# Patient Record
Sex: Female | Born: 1980 | Race: Black or African American | Hispanic: No | Marital: Married | Smoking: Never smoker
Health system: Southern US, Community
[De-identification: ages and names within clinical notes are randomized; demographics above are authoritative.]

## PROBLEM LIST (undated history)

## (undated) DIAGNOSIS — Z973 Presence of spectacles and contact lenses: Secondary | ICD-10-CM

## (undated) DIAGNOSIS — O139 Gestational [pregnancy-induced] hypertension without significant proteinuria, unspecified trimester: Secondary | ICD-10-CM

## (undated) DIAGNOSIS — R51 Headache: Secondary | ICD-10-CM

## (undated) DIAGNOSIS — J45909 Unspecified asthma, uncomplicated: Secondary | ICD-10-CM

## (undated) DIAGNOSIS — K449 Diaphragmatic hernia without obstruction or gangrene: Secondary | ICD-10-CM

## (undated) DIAGNOSIS — D259 Leiomyoma of uterus, unspecified: Secondary | ICD-10-CM

## (undated) DIAGNOSIS — B999 Unspecified infectious disease: Secondary | ICD-10-CM

## (undated) DIAGNOSIS — J309 Allergic rhinitis, unspecified: Secondary | ICD-10-CM

## (undated) DIAGNOSIS — D509 Iron deficiency anemia, unspecified: Secondary | ICD-10-CM

## (undated) HISTORY — DX: Gestational (pregnancy-induced) hypertension without significant proteinuria, unspecified trimester: O13.9

## (undated) HISTORY — DX: Unspecified infectious disease: B99.9

## (undated) HISTORY — PX: OVARIAN CYST SURGERY: SHX726

## (undated) HISTORY — DX: Headache: R51

---

## 2002-09-16 HISTORY — PX: RIGHT OOPHORECTOMY: SHX2359

## 2009-09-16 DIAGNOSIS — G43E09 Chronic migraine with aura, not intractable, without status migrainosus: Secondary | ICD-10-CM

## 2009-09-16 DIAGNOSIS — R51 Headache: Secondary | ICD-10-CM

## 2009-09-16 HISTORY — DX: Headache: R51

## 2009-09-16 HISTORY — DX: Chronic migraine with aura, not intractable, without status migrainosus: G43.E09

## 2010-09-16 DIAGNOSIS — O139 Gestational [pregnancy-induced] hypertension without significant proteinuria, unspecified trimester: Secondary | ICD-10-CM

## 2010-09-16 DIAGNOSIS — Z8759 Personal history of other complications of pregnancy, childbirth and the puerperium: Secondary | ICD-10-CM

## 2010-09-16 HISTORY — DX: Personal history of other complications of pregnancy, childbirth and the puerperium: Z87.59

## 2010-09-16 HISTORY — DX: Gestational (pregnancy-induced) hypertension without significant proteinuria, unspecified trimester: O13.9

## 2012-09-16 DIAGNOSIS — J45909 Unspecified asthma, uncomplicated: Secondary | ICD-10-CM

## 2012-09-16 DIAGNOSIS — B999 Unspecified infectious disease: Secondary | ICD-10-CM

## 2012-09-16 DIAGNOSIS — Z8759 Personal history of other complications of pregnancy, childbirth and the puerperium: Secondary | ICD-10-CM

## 2012-09-16 HISTORY — DX: Personal history of other complications of pregnancy, childbirth and the puerperium: Z87.59

## 2012-09-16 HISTORY — DX: Unspecified asthma, uncomplicated: J45.909

## 2012-09-16 HISTORY — DX: Unspecified infectious disease: B99.9

## 2012-09-16 NOTE — L&D Delivery Note (Signed)
Delivery Note At 6:26 PM a viable female, "Whitney Silva",  was delivered via Vaginal, Spontaneous Delivery (Presentation: Right Occiput Anterior).  APGAR: 8, 9; weight 6 lb 1.2 oz (2756 g).   Occult prolapse noted at delivery. Placenta status: Intact, Spontaneous Pathology.  Cord: 3 vessels with the following complications: None.  Cord pH: NA  Anesthesia: None  Episiotomy: None Lacerations: 2nd degree;Perineal Suture Repair: 3.0 monocryl Est. Blood Loss (mL): 250  Mom to postpartum.  Baby to skin to skin.  Will continue magnesium sulfate infusion x 24 hours post delivery Repeat PIH labs in the am. Continue Labetalol 200 mg po BID, with prn IV Labetalol for systolic BP >: 160 or diastolic > 105. Placenta to pathology..  Family plans inpatient circumcision.  Nigel Bridgeman 03/26/2013, 7:18 PM

## 2012-10-01 ENCOUNTER — Emergency Department (HOSPITAL_COMMUNITY): Payer: 59

## 2012-10-01 ENCOUNTER — Inpatient Hospital Stay (HOSPITAL_COMMUNITY)
Admission: EM | Admit: 2012-10-01 | Discharge: 2012-10-03 | DRG: 781 | Disposition: A | Discharge status: Home / Self Care | Payer: 59 | Attending: Internal Medicine | Admitting: Internal Medicine

## 2012-10-01 ENCOUNTER — Encounter (HOSPITAL_COMMUNITY): Payer: Self-pay | Admitting: *Deleted

## 2012-10-01 DIAGNOSIS — E876 Hypokalemia: Secondary | ICD-10-CM | POA: Diagnosis present

## 2012-10-01 DIAGNOSIS — E872 Acidosis, unspecified: Secondary | ICD-10-CM

## 2012-10-01 DIAGNOSIS — R1110 Vomiting, unspecified: Secondary | ICD-10-CM

## 2012-10-01 DIAGNOSIS — J9600 Acute respiratory failure, unspecified whether with hypoxia or hypercapnia: Secondary | ICD-10-CM | POA: Diagnosis present

## 2012-10-01 DIAGNOSIS — D649 Anemia, unspecified: Secondary | ICD-10-CM

## 2012-10-01 DIAGNOSIS — Z886 Allergy status to analgesic agent status: Secondary | ICD-10-CM

## 2012-10-01 DIAGNOSIS — J45909 Unspecified asthma, uncomplicated: Secondary | ICD-10-CM

## 2012-10-01 DIAGNOSIS — J101 Influenza due to other identified influenza virus with other respiratory manifestations: Secondary | ICD-10-CM

## 2012-10-01 DIAGNOSIS — A419 Sepsis, unspecified organism: Secondary | ICD-10-CM | POA: Diagnosis present

## 2012-10-01 DIAGNOSIS — E860 Dehydration: Secondary | ICD-10-CM | POA: Diagnosis present

## 2012-10-01 DIAGNOSIS — R062 Wheezing: Secondary | ICD-10-CM

## 2012-10-01 DIAGNOSIS — O99019 Anemia complicating pregnancy, unspecified trimester: Secondary | ICD-10-CM | POA: Diagnosis present

## 2012-10-01 DIAGNOSIS — R6510 Systemic inflammatory response syndrome (SIRS) of non-infectious origin without acute organ dysfunction: Secondary | ICD-10-CM | POA: Diagnosis present

## 2012-10-01 DIAGNOSIS — O98819 Other maternal infectious and parasitic diseases complicating pregnancy, unspecified trimester: Principal | ICD-10-CM | POA: Diagnosis present

## 2012-10-01 DIAGNOSIS — D72829 Elevated white blood cell count, unspecified: Secondary | ICD-10-CM | POA: Diagnosis present

## 2012-10-01 DIAGNOSIS — R509 Fever, unspecified: Secondary | ICD-10-CM

## 2012-10-01 DIAGNOSIS — O99891 Other specified diseases and conditions complicating pregnancy: Secondary | ICD-10-CM | POA: Diagnosis present

## 2012-10-01 DIAGNOSIS — J45901 Unspecified asthma with (acute) exacerbation: Secondary | ICD-10-CM | POA: Diagnosis present

## 2012-10-01 HISTORY — DX: Unspecified asthma, uncomplicated: J45.909

## 2012-10-01 LAB — CBC WITH DIFFERENTIAL/PLATELET
Basophils Relative: 0 % (ref 0–1)
Eosinophils Absolute: 0 10*3/uL (ref 0.0–0.7)
Eosinophils Relative: 0 % (ref 0–5)
HCT: 38.8 % (ref 36.0–46.0)
Hemoglobin: 13.8 g/dL (ref 12.0–15.0)
MCH: 29.4 pg (ref 26.0–34.0)
MCHC: 35.6 g/dL (ref 30.0–36.0)
MCV: 82.6 fL (ref 78.0–100.0)
Monocytes Absolute: 0.6 10*3/uL (ref 0.1–1.0)
Monocytes Relative: 5 % (ref 3–12)

## 2012-10-01 LAB — POCT I-STAT, CHEM 8
BUN: <3 mg/dL — ABNORMAL LOW (ref 6–23)
Creatinine, Ser: 0.7 mg/dL (ref 0.50–1.10)
Sodium: 137 mEq/L (ref 135–145)
TCO2: 23 mmol/L (ref 0–100)

## 2012-10-01 MED ORDER — ALBUTEROL SULFATE (5 MG/ML) 0.5% IN NEBU
2.5000 mg | INHALATION_SOLUTION | RESPIRATORY_TRACT | Status: DC
  Filled 2012-10-01: qty 0.5

## 2012-10-01 MED ORDER — ONDANSETRON HCL 4 MG PO TABS: 4.0000 mg | ORAL_TABLET | Freq: Four times a day (QID) | ORAL | Status: DC | PRN

## 2012-10-01 MED ORDER — ALBUTEROL SULFATE (5 MG/ML) 0.5% IN NEBU: 2.5000 mg | INHALATION_SOLUTION | RESPIRATORY_TRACT | Status: DC | PRN

## 2012-10-01 MED ORDER — ENOXAPARIN SODIUM 30 MG/0.3ML ~~LOC~~ SOLN: 30.0000 mg | SUBCUTANEOUS | Status: DC

## 2012-10-01 MED ORDER — IPRATROPIUM BROMIDE 0.02 % IN SOLN
0.5000 mg | Freq: Four times a day (QID) | RESPIRATORY_TRACT | Status: DC
  Administered 2012-10-02 (×2): 0.5 mg via RESPIRATORY_TRACT
  Filled 2012-10-01 (×2): qty 2.5

## 2012-10-01 MED ORDER — METHYLPREDNISOLONE SODIUM SUCC 125 MG IJ SOLR
80.0000 mg | Freq: Four times a day (QID) | INTRAMUSCULAR | Status: DC
  Administered 2012-10-02: 80 mg via INTRAVENOUS
  Filled 2012-10-01: qty 1.28
  Filled 2012-10-01: qty 2
  Filled 2012-10-01 (×4): qty 1.28

## 2012-10-01 MED ORDER — ALBUTEROL SULFATE (5 MG/ML) 0.5% IN NEBU
2.5000 mg | INHALATION_SOLUTION | Freq: Four times a day (QID) | RESPIRATORY_TRACT | Status: DC
  Administered 2012-10-02: 2.5 mg via RESPIRATORY_TRACT
  Filled 2012-10-01: qty 0.5

## 2012-10-01 MED ORDER — SODIUM CHLORIDE 0.9 % IV BOLUS (SEPSIS)
1000.0000 mL | Freq: Once | INTRAVENOUS | Status: AC
  Administered 2012-10-01: 1000 mL via INTRAVENOUS

## 2012-10-01 MED ORDER — METHYLPREDNISOLONE SODIUM SUCC 125 MG IJ SOLR
125.0000 mg | Freq: Once | INTRAMUSCULAR | Status: AC
  Administered 2012-10-01: 125 mg via INTRAVENOUS
  Filled 2012-10-01: qty 2

## 2012-10-01 MED ORDER — OSELTAMIVIR PHOSPHATE 75 MG PO CAPS
75.0000 mg | ORAL_CAPSULE | Freq: Two times a day (BID) | ORAL | Status: DC
  Administered 2012-10-02 (×3): 75 mg via ORAL
  Filled 2012-10-01 (×5): qty 1

## 2012-10-01 MED ORDER — ONDANSETRON HCL 4 MG/2ML IJ SOLN: 4.0000 mg | Freq: Three times a day (TID) | INTRAMUSCULAR | Status: DC | PRN

## 2012-10-01 MED ORDER — IPRATROPIUM BROMIDE 0.02 % IN SOLN
0.5000 mg | Freq: Once | RESPIRATORY_TRACT | Status: AC
  Administered 2012-10-01: 0.5 mg via RESPIRATORY_TRACT
  Filled 2012-10-01: qty 2.5

## 2012-10-01 MED ORDER — ALBUTEROL SULFATE (5 MG/ML) 0.5% IN NEBU
5.0000 mg | INHALATION_SOLUTION | Freq: Once | RESPIRATORY_TRACT | Status: AC
  Administered 2012-10-01: 5 mg via RESPIRATORY_TRACT
  Filled 2012-10-01: qty 1

## 2012-10-01 MED ORDER — POTASSIUM CHLORIDE CRYS ER 20 MEQ PO TBCR
40.0000 meq | EXTENDED_RELEASE_TABLET | Freq: Once | ORAL | Status: AC
  Administered 2012-10-02: 40 meq via ORAL
  Filled 2012-10-01: qty 2

## 2012-10-01 MED ORDER — ONDANSETRON HCL 4 MG/2ML IJ SOLN
4.0000 mg | Freq: Once | INTRAMUSCULAR | Status: AC
  Administered 2012-10-01: 4 mg via INTRAVENOUS
  Filled 2012-10-01: qty 2

## 2012-10-01 MED ORDER — HYDROCODONE-ACETAMINOPHEN 5-325 MG PO TABS
1.0000 | ORAL_TABLET | ORAL | Status: DC | PRN
  Administered 2012-10-02 (×3): 2 via ORAL
  Filled 2012-10-01 (×3): qty 2

## 2012-10-01 MED ORDER — ONDANSETRON HCL 4 MG/2ML IJ SOLN
4.0000 mg | Freq: Four times a day (QID) | INTRAMUSCULAR | Status: DC | PRN
  Administered 2012-10-02 (×3): 4 mg via INTRAVENOUS
  Filled 2012-10-01 (×4): qty 2

## 2012-10-01 MED ORDER — SODIUM CHLORIDE 0.9 % IV SOLN: INTRAVENOUS | Status: DC

## 2012-10-01 MED ORDER — SODIUM CHLORIDE 0.9 % IV SOLN
INTRAVENOUS | Status: DC
  Administered 2012-10-02 (×2): via INTRAVENOUS

## 2012-10-01 MED ORDER — SODIUM CHLORIDE 0.9 % IJ SOLN: 3.0000 mL | Freq: Two times a day (BID) | INTRAMUSCULAR | Status: DC

## 2012-10-01 MED ORDER — ACETAMINOPHEN 325 MG PO TABS
650.0000 mg | ORAL_TABLET | Freq: Once | ORAL | Status: AC
  Administered 2012-10-01: 650 mg via ORAL
  Filled 2012-10-01: qty 2

## 2012-10-01 MED ORDER — GUAIFENESIN-DM 100-10 MG/5ML PO SYRP: 5.0000 mL | ORAL_SOLUTION | ORAL | Status: DC | PRN

## 2012-10-01 NOTE — ED Notes (Signed)
Pt will urinate after breathing treatment.

## 2012-10-01 NOTE — H&P (Signed)
[facility] History and Physical  Whitney Silva ZOX:[id number] DOB: Jul 27, 1981 DOA: 10/01/2012  Referring physician: ED physician PCP: No primary provider on file.   Chief Complaint: Shortness of breath   HPI:  Pt is 32 yo female with asthma, [redacted] weeks pregnant who presents to [facility] ED with main concern of progressively worsening shortness of breath that initially started 1-2 days prior to admission and associated with fevers, chills, nausea, non bloody vomiting, generalized abdominal pain. Pt reports no specific aggravating or alleviating factors, no similar events in the past. She reports no chest pain, no other systemic symptoms, no recent sick contacts or exposures.  In ED, pt found to be febrile with T max 102 F and with leukocytosis, wheezing on exam even after albuterol and Atrovent administration, worrisome for H1N1. [facility] asked to admit to telemetry unit due to persistent tachycardia.  Assessment and Plan:  Principal Problem:  *Acute respiratory failure - unclear etiology at this time and possibly multifactorial and secondary to acute asthma exacerbation in the setting of ? H1N1  - will admit the pt to telemetry unit for further evaluation and management - will start with providing supportive care with Solumedrol, nebulizers scheduled and PRN, oxygen as needed - will also obtain H1N1 by PCR and place under droplet precautions - monitor vitals per floor protocol Active Problems:  Systemic inflammatory response syndrome (SIRS) - with leukocytosis, tachycardia, fever, source unknown yet and ? H1N1 - will start with supportive care as noted above and will also add Tamiflu until final reports is back - Tamiflu can be discontinued if PCR H1N1 negative - will provide IVF, monitor I's and O's  Nausea and vomiting - possibly related to flu illness - supportive care with antiemetics as needed  Hypokalemia - secondary to vomiting and albuterol nebulizer - supplement and repeat BMP in  AM  Code Status: Full Family Communication: Pt at bedside Disposition Plan: Admit to telemetry   Review of Systems:  Constitutional: Positive for fever, chills and malaise/fatigue. Negative for diaphoresis.  HENT: Negative for hearing loss, ear pain, nosebleeds, congestion, sore throat, neck pain, tinnitus and ear discharge.   Eyes: Negative for blurred vision, double vision, photophobia, pain, discharge and redness.  Respiratory: Positive for cough, shortness of breath, wheezing  Cardiovascular: Negative for chest pain, palpitations, orthopnea, claudication and leg swelling.  Gastrointestinal: Positive for nausea, vomiting and abdominal pain. Negative for heartburn, constipation, blood in stool and melena.  Genitourinary: Negative for dysuria, urgency, frequency, hematuria and flank pain.  Musculoskeletal: Negative for myalgias, back pain, joint pain and falls.  Skin: Negative for itching and rash.  Neurological: Negative for dizziness and weakness. Negative for tingling, tremors, sensory change, speech change, focal weakness, loss of consciousness and headaches.  Endo/Heme/Allergies: Negative for environmental allergies and polydipsia. Does not bruise/bleed easily.  Psychiatric/Behavioral: Negative for suicidal ideas. The patient is not nervous/anxious.      Past Medical History  Diagnosis Date  . Asthma     Past Surgical History  Procedure Date  . Ovarian cyst surgery     Social History:  reports that she has never smoked. She does not have any smokeless tobacco history on file. She reports that she does not drink alcohol or use illicit drugs.  Allergies  Allergen Reactions  . Nsaids Hives  . Aspirin Hives and Rash    No family history of cancers or heart disease   Prior to Admission medications   Medication Sig Start Date End Date Taking? Authorizing Provider  Multiple  Vitamin (MULTIVITAMIN WITH MINERALS) TABS Take 1 tablet by mouth daily.   Yes Historical Provider, MD     Physical Exam: Filed Vitals:   10/01/12 2200 10/01/12 2308 10/01/12 2313 10/01/12 2318  BP:   130/51   Pulse:   135   Temp: 102 F (38.9 C) 99.7 F (37.6 C) 100.9 F (38.3 C) 101.3 F (38.5 C)  TempSrc: Oral Oral Oral Rectal  Resp:   34   SpO2:   98%     Physical Exam  Constitutional: Appears well-developed and well-nourished. No distress.  HENT: Normocephalic. External right and left ear normal. Oropharynx is clear and moist.  Eyes: Conjunctivae and EOM are normal. PERRLA, no scleral icterus.  Neck: Normal ROM. Neck supple. No JVD. No tracheal deviation. No thyromegaly.  CVS: Regular rhythm, tachycardic, S1/S2 +, no murmurs, no gallops, no carotid bruit.  Pulmonary: Effort and breath sounds normal, expiratory wheezing present Abdominal: Soft. BS +,  no distension, tenderness, rebound or guarding.  Musculoskeletal: Normal range of motion. No edema and no tenderness.  Lymphadenopathy: No lymphadenopathy noted, cervical, inguinal. Neuro: Alert. Normal reflexes, muscle tone coordination. No cranial nerve deficit. Skin: Skin is warm and dry. No rash noted. Not diaphoretic. No erythema. No pallor.  Psychiatric: Normal mood and affect. Behavior, judgment, thought content normal.   Labs on Admission:  Basic Metabolic Panel:  Lab 10/01/12 1610  NA 137  K 3.4*  CL 103  CO2 --  GLUCOSE 100*  BUN <3*  CREATININE 0.70  CALCIUM --  MG --  PHOS --   CBC:  Lab 10/01/12 2233 10/01/12 2220  WBC -- 11.1*  NEUTROABS -- 9.9*  HGB 14.6 13.8  HCT 43.0 38.8  MCV -- 82.6  PLT -- 306   Radiological Exams on Admission: Dg Chest 2 View  10/01/2012  *RADIOLOGY REPORT*  Clinical Data: Cough, fever, shortness of breath  CHEST - 2 VIEW  Comparison: None.  Findings: Mild thoracic dextroscoliosis without evident underlying vertebral anomaly. Lungs clear.  Heart size and pulmonary vascularity normal.  No effusion.  IMPRESSION: No acute disease   Original Report Authenticated By: D.  Andria Rhein, MD     EKG: Normal sinus rhythm, no ST/T wave changes  Debbora Presto, MD  [facility] Pager [phone]  If 7PM-7AM, please contact night-coverage [email or web address] Password [facility] 10/01/2012, 11:36 PM

## 2012-10-01 NOTE — ED Notes (Signed)
Pt transported to XR.  

## 2012-10-01 NOTE — ED Notes (Signed)
Pt could not urinate.  Will try again in 30 minutes.   

## 2012-10-01 NOTE — ED Provider Notes (Signed)
History     CSN: [id number]  Arrival date & time 10/01/12  2039   First MD Initiated Contact with Patient 10/01/12 2150      Chief Complaint  Patient presents with  . Cough     HPI Patient began having cough fever chills and myalgias yesterday associated with nausea and vomiting today.  Patient's states she's approximately [redacted] weeks pregnant.  She has no previous medical history other than asthma.  She has noticed wheezing. Past Medical History  Diagnosis Date  . Asthma     Past Surgical History  Procedure Date  . Ovarian cyst surgery     No family history on file.  History  Substance Use Topics  . Smoking status: Never Smoker   . Smokeless tobacco: Not on file  . Alcohol Use: No    OB History    Grav Para Term Preterm Abortions TAB SAB Ect Mult Living   1               Review of Systems All other systems reviewed and are negative Allergies  Nsaids and Aspirin  Home Medications   Current Outpatient Rx  Name  Route  Sig  Dispense  Refill  . ADULT MULTIVITAMIN W/MINERALS CH   Oral   Take 1 tablet by mouth daily.           BP 130/51  Pulse 135  Temp 101.3 F (38.5 C) (Rectal)  Resp 34  SpO2 98%  LMP 07/12/2012  Physical Exam  Nursing note and vitals reviewed. Constitutional: She is oriented to person, place, and time. She appears well-developed and well-nourished. No distress.  HENT:  Head: Normocephalic and atraumatic.  Mouth/Throat: Mucous membranes are dry.  Eyes: Pupils are equal, round, and reactive to light.  Neck: Normal range of motion.  Cardiovascular: Intact distal pulses.  Tachycardia present.   Pulmonary/Chest: No respiratory distress.  Abdominal: Normal appearance. She exhibits no distension.  Musculoskeletal: Normal range of motion.  Neurological: She is alert and oriented to person, place, and time. No cranial nerve deficit.  Skin: Skin is warm and dry. No rash noted.  Psychiatric: She has a normal mood and affect. Her behavior  is normal.    ED Course  Procedures (including critical care time)  Labs Reviewed  CBC WITH DIFFERENTIAL - Abnormal; Notable for the following:    WBC 11.1 (*)     Neutrophils Relative 90 (*)     Neutro Abs 9.9 (*)     Lymphocytes Relative 5 (*)     Lymphs Abs 0.5 (*)     All other components within normal limits  HCG, SERUM, QUALITATIVE - Abnormal; Notable for the following:    Preg, Serum POSITIVE (*)     All other components within normal limits  POCT I-STAT, CHEM 8 - Abnormal; Notable for the following:    Potassium 3.4 (*)     BUN <3 (*)     Glucose, Bld 100 (*)     All other components within normal limits  INFLUENZA PANEL BY PCR  URINALYSIS, ROUTINE W REFLEX MICROSCOPIC     Medications  Multiple Vitamin (MULTIVITAMIN WITH MINERALS) TABS (not administered)  sodium chloride 0.9 % bolus 1,000 mL (1000 mL Intravenous New Bag/Given 10/01/12 2236)  albuterol (PROVENTIL) (5 MG/ML) 0.5% nebulizer solution 5 mg (5 mg Nebulization Given 10/01/12 2236)  ipratropium (ATROVENT) nebulizer solution 0.5 mg (0.5 mg Nebulization Given 10/01/12 2236)  ondansetron (ZOFRAN) injection 4 mg (4 mg Intravenous Given 10/01/12 2236)  acetaminophen (TYLENOL) tablet 650 mg (650 mg Oral Given 10/01/12 2206)  methylPREDNISolone sodium succinate (SOLU-MEDROL) 125 mg/2 mL injection 125 mg (125 mg Intravenous Given 10/01/12 2314)    Dg Chest 2 View  10/01/2012  *RADIOLOGY REPORT*  Clinical Data: Cough, fever, shortness of breath  CHEST - 2 VIEW  Comparison: None.  Findings: Mild thoracic dextroscoliosis without evident underlying vertebral anomaly. Lungs clear.  Heart size and pulmonary vascularity normal.  No effusion.  IMPRESSION: No acute disease   Original Report Authenticated By: D. Andria Rhein, MD      1. Febrile illness   2. Asthma   3. Wheezing   4. Vomiting   5. Dehydration       MDM         Nelia Shi, MD 10/01/12 2325

## 2012-10-01 NOTE — ED Notes (Signed)
Pt says she started coughing yesterday and has had nausea and vomiting today. Pt says she has had chills and feels hot. Voices [redacted] weeks pregnant

## 2012-10-01 NOTE — ED Notes (Signed)
Pt back from XR 

## 2012-10-01 NOTE — ED Notes (Signed)
MD at bedside. 

## 2012-10-02 ENCOUNTER — Encounter (HOSPITAL_COMMUNITY): Payer: Self-pay | Admitting: Emergency Medicine

## 2012-10-02 DIAGNOSIS — E872 Acidosis, unspecified: Secondary | ICD-10-CM

## 2012-10-02 DIAGNOSIS — E876 Hypokalemia: Secondary | ICD-10-CM

## 2012-10-02 DIAGNOSIS — D649 Anemia, unspecified: Secondary | ICD-10-CM

## 2012-10-02 DIAGNOSIS — J101 Influenza due to other identified influenza virus with other respiratory manifestations: Secondary | ICD-10-CM

## 2012-10-02 LAB — BASIC METABOLIC PANEL
CO2: 19 mEq/L (ref 19–32)
Calcium: 9.4 mg/dL (ref 8.4–10.5)
Chloride: 106 mEq/L (ref 96–112)
GFR calc Af Amer: >90 mL/min (ref >90)
Glucose, Bld: 156 mg/dL — ABNORMAL HIGH (ref 70–99)
Potassium: 3.4 mEq/L — ABNORMAL LOW (ref 3.5–5.1)
Sodium: 136 mEq/L (ref 135–145)
Sodium: 135 mEq/L (ref 135–145)

## 2012-10-02 LAB — CBC
HCT: 33 % — ABNORMAL LOW (ref 36.0–46.0)
Hemoglobin: 11.2 g/dL — ABNORMAL LOW (ref 12.0–15.0)
RDW: 13.2 % (ref 11.5–15.5)
WBC: 8.3 10*3/uL (ref 4.0–10.5)

## 2012-10-02 LAB — RAPID URINE DRUG SCREEN, HOSP PERFORMED
Barbiturates: NOT DETECTED
Benzodiazepines: NOT DETECTED
Cocaine: NOT DETECTED
Tetrahydrocannabinol: NOT DETECTED

## 2012-10-02 LAB — URINALYSIS, ROUTINE W REFLEX MICROSCOPIC
Bilirubin Urine: NEGATIVE
Hgb urine dipstick: NEGATIVE
Protein, ur: NEGATIVE mg/dL
Specific Gravity, Urine: 1.01 (ref 1.005–1.030)
Urobilinogen, UA: 0.2 mg/dL (ref 0.0–1.0)

## 2012-10-02 MED ORDER — LEVALBUTEROL HCL 1.25 MG/0.5ML IN NEBU
1.2500 mg | INHALATION_SOLUTION | RESPIRATORY_TRACT | Status: DC | PRN
  Filled 2012-10-02: qty 0.5

## 2012-10-02 MED ORDER — POTASSIUM CHLORIDE CRYS ER 20 MEQ PO TBCR
40.0000 meq | EXTENDED_RELEASE_TABLET | Freq: Once | ORAL | Status: AC
  Administered 2012-10-02: 40 meq via ORAL
  Filled 2012-10-02: qty 2

## 2012-10-02 MED ORDER — ENOXAPARIN SODIUM 40 MG/0.4ML ~~LOC~~ SOLN
40.0000 mg | SUBCUTANEOUS | Status: DC
  Administered 2012-10-02: 40 mg via SUBCUTANEOUS
  Filled 2012-10-02 (×2): qty 0.4

## 2012-10-02 MED ORDER — LEVALBUTEROL HCL 1.25 MG/0.5ML IN NEBU
1.2500 mg | INHALATION_SOLUTION | Freq: Three times a day (TID) | RESPIRATORY_TRACT | Status: DC
  Administered 2012-10-02 – 2012-10-03 (×2): 1.25 mg via RESPIRATORY_TRACT
  Filled 2012-10-02 (×6): qty 0.5

## 2012-10-02 MED ORDER — PRENATAL MULTIVITAMIN CH
1.0000 | ORAL_TABLET | Freq: Every day | ORAL | Status: DC
  Administered 2012-10-02: 1 via ORAL
  Filled 2012-10-02 (×2): qty 1

## 2012-10-02 MED ORDER — IPRATROPIUM BROMIDE 0.02 % IN SOLN
0.5000 mg | Freq: Four times a day (QID) | RESPIRATORY_TRACT | Status: DC
  Administered 2012-10-02: 0.5 mg via RESPIRATORY_TRACT
  Filled 2012-10-02: qty 2.5

## 2012-10-02 MED ORDER — LEVALBUTEROL HCL 1.25 MG/0.5ML IN NEBU
1.2500 mg | INHALATION_SOLUTION | Freq: Four times a day (QID) | RESPIRATORY_TRACT | Status: DC
  Administered 2012-10-02 (×2): 1.25 mg via RESPIRATORY_TRACT
  Filled 2012-10-02 (×5): qty 0.5

## 2012-10-02 MED ORDER — COMPLETENATE 29-1 MG PO CHEW
1.0000 | CHEWABLE_TABLET | Freq: Every day | ORAL | Status: DC
  Filled 2012-10-02: qty 1

## 2012-10-02 MED ORDER — PREDNISONE 50 MG PO TABS
60.0000 mg | ORAL_TABLET | Freq: Every day | ORAL | Status: DC
  Filled 2012-10-02 (×2): qty 1

## 2012-10-02 MED ORDER — IPRATROPIUM BROMIDE 0.02 % IN SOLN
0.5000 mg | Freq: Three times a day (TID) | RESPIRATORY_TRACT | Status: DC
  Administered 2012-10-02 – 2012-10-03 (×2): 0.5 mg via RESPIRATORY_TRACT
  Filled 2012-10-02 (×2): qty 2.5

## 2012-10-02 MED ORDER — ACETAMINOPHEN 325 MG PO TABS
650.0000 mg | ORAL_TABLET | ORAL | Status: DC | PRN
  Administered 2012-10-02 – 2012-10-03 (×4): 650 mg via ORAL
  Filled 2012-10-02 (×4): qty 2

## 2012-10-02 NOTE — ED Notes (Signed)
Influenza PCR requisition sent down lab. Improper collection sent (red top). No influenza PCR tubes available in ED. Informed lab. Lab sent proper tube to 4 west. Did not administer albuterol before due to high HR.

## 2012-10-02 NOTE — ED Notes (Signed)
Attempted to call report. Receiving RN busy. 

## 2012-10-02 NOTE — Progress Notes (Signed)
MD notified of patients transfer order and d/c tele order. Pt does not want to transfer rooms at 10pm. Orders to d/c tele and keep patient on unit for tonight. Will continue to monitor.  Whitney Silva. Clelia Croft, RN

## 2012-10-02 NOTE — Progress Notes (Signed)
Rx Brief Lovenox note  Pt wt=64 kg, CrCl=93 ml/min  Adjust Lovenox to 40mg  daily for DVT prophylaxis  Lorenza Evangelist 10/02/2012 1:35 AM

## 2012-10-02 NOTE — ED Notes (Signed)
MD left bedside

## 2012-10-02 NOTE — Progress Notes (Addendum)
[facility] PROGRESS NOTE  Whitney Silva ZOX:[id number] DOB: [birth year] DOA: 10/01/2012 PCP: No primary provider on file.  Assessment/Plan  Acute respiratory failure, resolving.  Due to H1N1 and status asthmaticus.   - Continue tamiflu - Wean IV steroids -  Continue nebulizer treatments  Active Problems:  Systemic inflammatory response syndrome (SIRS)  - with leukocytosis, tachycardia, fever due to H1N1  - persistent sinus tachycardia in the 110s to 130s.   - Continue supportive care and IVF Myalgias:  Should not require narcotics for flu aches -  D/c vicodin   -  Tylenol prn  Nausea and vomiting  - possibly related to flu illness - supportive care with antiemetics as needed   Hypokalemia  - secondary to vomiting and albuterol nebulizer  - supplement and repeat BMP in AM Borderline gap metabolic acidosis:  Likely due to ketosis from poor oral intake.  Patient is going to try to eat some today -  Lactic acid also mildly elevated -  Increase IVF -  Encouraged PO intake as tolerated Leukocytosis resolved.   Normocytic anemia:  May have some anemia from pregnancy, acute infection, or IVF -  Trend hgb  Pregnancy: -  Current medications are safe in pregnancy (including narcotics, lovenox, steroids) -  Start PNV  Diet:  Regular Access:  PIV IVF:  NS at 72ml/h Proph:  lovenox  Code Status: full code Family Communication: spoke with patient alone Disposition Plan: pending improvement in metabolic acidosis, tolerating diet, likely tomorrow morning   Consultants:  none  Procedures:  none  Antibiotics:  Tamiflu 1/16 >>   HPI/Subjective: Patient states that she is still feeling somewhat [name] of breath even at rest and particularly with sitting up and eating.  Nausea and vomiting are improving and is going to try to eat some today.  Denies diarrhea and constipation and dysuria.  Has some muscle aches and pains.    Objective: Filed Vitals:   10/02/12 0100  10/02/12 0129 10/02/12 0217 10/02/12 0434  BP:  127/67  121/51  Pulse: 127 129  112  Temp:  99.4 F (37.4 C)  98.4 F (36.9 C)  TempSrc:  Oral  Oral  Resp: 23 18  18   Height:  5\' 5"  (1.651 m)    Weight:  64.184 kg (141 lb 8 oz)    SpO2: 98% 99% 98% 99%    Intake/Output Summary (Last 24 hours) at 10/02/12 1401 Last data filed at 10/02/12 1240  Gross per 24 hour  Intake    500 ml  Output   3050 ml  Net  -2550 ml   Filed Weights   10/02/12 0129  Weight: 64.184 kg (141 lb 8 oz)    Exam:   General:  Average weight AAF, moderate tachypnea  HEENT:  MMM  Cardiovascular:  Tachycardic, regular rhythm, 1/6 murmur at the LSB, no rubs or gallops, 2+ pulses  Respiratory:  Course bilateral breath sounds and more diminished in the LUL.  No focal rales or rhonchi  Abdomen:  NABS, soft, nondistended, nontender  MSK:  Normal tone and bulk, no LEE  Neuro:  A&Ox4, grossly intact  Data Reviewed: Basic Metabolic Panel:  Lab 10/02/12 8119 10/02/12 0421 10/01/12 2233  NA 135 136 137  K 3.2* 3.4* 3.4*  CL 105 106 103  CO2 19 17* --  GLUCOSE 156* 164* 100*  BUN <3* <3* <3*  CREATININE 0.39* 0.39* 0.70  CALCIUM 9.4 9.2 --  MG -- -- --  PHOS -- -- --  Liver Function Tests: No results found for this basename: AST:5,ALT:5,ALKPHOS:5,BILITOT:5,PROT:5,ALBUMIN:5 in the last 168 hours No results found for this basename: LIPASE:5,AMYLASE:5 in the last 168 hours No results found for this basename: AMMONIA:5 in the last 168 hours CBC:  Lab 10/02/12 0421 10/01/12 2233 10/01/12 2220  WBC 8.3 -- 11.1*  NEUTROABS -- -- 9.9*  HGB 11.2* 14.6 13.8  HCT 33.0* 43.0 38.8  MCV 83.1 -- 82.6  PLT 291 -- 306   Cardiac Enzymes: No results found for this basename: CKTOTAL:5,CKMB:5,CKMBINDEX:5,TROPONINI:5 in the last 168 hours BNP (last 3 results) No results found for this basename: PROBNP:3 in the last 8760 hours CBG: No results found for this basename: GLUCAP:5 in the last 168 hours  No  results found for this or any previous visit (from the past 240 hour(s)).   Studies: Dg Chest 2 View  10/01/2012  *RADIOLOGY REPORT*  Clinical Data: Cough, fever, shortness of breath  CHEST - 2 VIEW  Comparison: None.  Findings: Mild thoracic dextroscoliosis without evident underlying vertebral anomaly. Lungs clear.  Heart size and pulmonary vascularity normal.  No effusion.  IMPRESSION: No acute disease   Original Report Authenticated By: D. Andria Rhein, MD     Scheduled Meds:   . enoxaparin (LOVENOX) injection  40 mg Subcutaneous Q24H  . ipratropium  0.5 mg Nebulization TID  . levalbuterol  1.25 mg Nebulization TID  . oseltamivir  75 mg Oral BID  . predniSONE  60 mg Oral Q breakfast  . sodium chloride  3 mL Intravenous Q12H   Continuous Infusions:   . sodium chloride 75 mL/hr at 10/02/12 1234    Principal Problem:  *Acute respiratory failure Active Problems:  Leukocytosis  Hypokalemia  Systemic inflammatory response syndrome (SIRS)    Time spent: 30 min    Ethel Meisenheimer, [facility]  [facility] Pager [phone]. If 8PM-8AM, please contact night-coverage at [email or web address], password [facility] 10/02/2012, 2:01 PM  LOS: 1 day

## 2012-10-03 DIAGNOSIS — D649 Anemia, unspecified: Secondary | ICD-10-CM

## 2012-10-03 LAB — BASIC METABOLIC PANEL
BUN: 3 mg/dL — ABNORMAL LOW (ref 6–23)
CO2: 26 mEq/L (ref 19–32)
Calcium: 9.2 mg/dL (ref 8.4–10.5)
Creatinine, Ser: 0.44 mg/dL — ABNORMAL LOW (ref 0.50–1.10)
GFR calc non Af Amer: >90 mL/min (ref >90)
Glucose, Bld: 90 mg/dL (ref 70–99)
Sodium: 135 mEq/L (ref 135–145)

## 2012-10-03 LAB — CBC
MCH: 28.2 pg (ref 26.0–34.0)
MCHC: 33.7 g/dL (ref 30.0–36.0)
MCV: 83.5 fL (ref 78.0–100.0)
Platelets: 282 10*3/uL (ref 150–400)
RBC: 3.87 MIL/uL (ref 3.87–5.11)

## 2012-10-03 MED ORDER — PREDNISONE 20 MG PO TABS: 60.0000 mg | ORAL_TABLET | Freq: Every day | ORAL | Status: DC

## 2012-10-03 MED ORDER — POTASSIUM CHLORIDE CRYS ER 20 MEQ PO TBCR
60.0000 meq | EXTENDED_RELEASE_TABLET | Freq: Once | ORAL | Status: DC
  Filled 2012-10-03: qty 3

## 2012-10-03 MED ORDER — OSELTAMIVIR PHOSPHATE 75 MG PO CAPS: 75.0000 mg | ORAL_CAPSULE | Freq: Two times a day (BID) | ORAL | Status: DC

## 2012-10-03 MED ORDER — ONDANSETRON HCL 4 MG PO TABS: 4.0000 mg | ORAL_TABLET | Freq: Four times a day (QID) | ORAL | Status: DC | PRN

## 2012-10-03 MED ORDER — ALBUTEROL SULFATE HFA 108 (90 BASE) MCG/ACT IN AERS: 2.0000 | INHALATION_SPRAY | Freq: Four times a day (QID) | RESPIRATORY_TRACT | Status: DC | PRN

## 2012-10-03 NOTE — Discharge Summary (Signed)
Physician Discharge Summary  Whitney Silva ZOX:[id number] DOB: [birth year] DOA: 10/01/2012  PCP: No primary provider on file.  Admit date: 10/01/2012 Discharge date: 10/03/2012  Recommendations for Outpatient Follow-up:  1. Follow up with primary care doctor or obstetrician in 2 weeks.  Discharge Diagnoses:  Principal Problem:  *Acute respiratory failure Active Problems:  Hypokalemia  Systemic inflammatory response syndrome (SIRS)  Normocytic anemia  Metabolic acidosis, normal anion gap (NAG)  Lactic acid increased  H1N1 influenza   Discharge Condition: stable, improved  Diet recommendation: regular  Wt Readings from Last 3 Encounters:  10/02/12 64.184 kg (141 lb 8 oz)    History of present illness:   Pt is 32 yo female with asthma, [redacted] weeks pregnant who presents to [facility] ED with main concern of progressively worsening shortness of breath that initially started 1-2 days prior to admission and associated with fevers, chills, nausea, non bloody vomiting, generalized abdominal pain. Pt reports no specific aggravating or alleviating factors, no similar events in the past. She reports no chest pain, no other systemic symptoms, no recent sick contacts or exposures.  In ED, pt found to be febrile with T max 102 F and with leukocytosis, wheezing on exam even after albuterol and Atrovent administration, worrisome for H1N1. TR[facility] asked to admit to telemetry unit due to persistent tachycardia.   Hospital Course:   Ms. Whitney Silva was admitted with respiratory distress.  She was found to have H1N1 flu and she was started on tamiflu.  She was additionally treated for asthma exacerbation, initially with IV solumedro, which was transitioned to oral prednisone to complete a Birch Farino course.  She was also given duonebs and as needed albuterol.  She remained in room air and her tachypnea resolved.  She maintained oxygen saturations in the mid to high 90s.    SIRS:  When she was initially admitted, she had high  fever and sinus tachycardia to the 110s and 130s which resolved with improvement in her fever and with IV hydration.    Myalgias and headache were likely secondary to the flu and responded to tylenol.  Nausea and vomiting were also likely secondary to the flu adn responded to antiemetics.  She was able to eat and drink adequately prior to discharge.    Hypokalemia was likely secondary to vomiting and albuterol nebulizer and resolved with supplementation Borderline gap metabolic acidosis: Likely due to ketosis and lactic acidosis from poor oral intake.  Resolved with hydration.   Leukocytosis resolved quickly with initiation of tamiflu.  Normocytic anemia: May have some anemia from pregnancy, acute infection, or IVF.  She should follow up with her PCP or obstetrician for ongoing monitoring after resolution of acute illness.    Pregnancy:  Continued PNV  Consultants:  none Procedures:  none Antibiotics:  Tamiflu 1/16 >>   Discharge Exam: Filed Vitals:   10/03/12 0454  BP: 129/76  Pulse: 86  Temp: 98.2 F (36.8 C)  Resp: 16   Filed Vitals:   10/02/12 0217 10/02/12 0434 10/02/12 1500 10/03/12 0454  BP:  121/51 129/58 129/76  Pulse:  112 95 86  Temp:  98.4 F (36.9 C) 98.4 F (36.9 C) 98.2 F (36.8 C)  TempSrc:  Oral Oral Oral  Resp:  18 18 16   Height:      Weight:      SpO2: 98% 99% 100% 98%   Mild headache this morning, but less nausea and was able to eat yesterday.  Normal BM yesterday.  Improved SOB but persistent cough.  General: Average weight AAF, no tachypnea  HEENT: MMM  Cardiovascular: RR, no mrg, 2+ pulses  Respiratory:  CTAB Abdomen: NABS, soft, nondistended, nontender  MSK: Normal tone and bulk, no LEE  Neuro: A&Ox4, grossly intact  Discharge Instructions      Discharge Orders    Future Orders Please Complete By Expires   Diet general      Increase activity slowly      Discharge instructions      Comments:   You were hospitalized with influenza  and an asthma exacerbation.  You also had some dehydration.  You were started on tamiflu.  Please take this medication twice a day and your next dose is this morning.  Take all the tabs until they are gone.  You were also started on steroids for asthma.  Please take a Shirlyn Savin course of steroids, your next dose is this morning.  Use albuterol 2 puffs every 4-6 hours as needed for shortness of breath.  You may use zofran as needed for nausea.  Please drink plenty of fluids and rest.  Please wear a face mask when going out in public until you are done with your tamiflu to prevent the spread of flu.   Call MD for:  temperature >100.4      Call MD for:  persistant nausea and vomiting      Call MD for:  severe uncontrolled pain      Call MD for:  difficulty breathing, headache or visual disturbances      Call MD for:  hives      Call MD for:  persistant dizziness or light-headedness      Call MD for:  extreme fatigue          Medication List     As of 10/03/2012  7:58 AM    TAKE these medications         albuterol 108 (90 BASE) MCG/ACT inhaler   Commonly known as: PROVENTIL HFA;VENTOLIN HFA   Inhale 2 puffs into the lungs every 6 (six) hours as needed for wheezing.      multivitamin with minerals Tabs   Take 1 tablet by mouth daily.      ondansetron 4 MG tablet   Commonly known as: ZOFRAN   Take 1 tablet (4 mg total) by mouth every 6 (six) hours as needed for nausea.      oseltamivir 75 MG capsule   Commonly known as: TAMIFLU   Take 1 capsule (75 mg total) by mouth 2 (two) times daily.      predniSONE 20 MG tablet   Commonly known as: DELTASONE   Take 3 tablets (60 mg total) by mouth daily with breakfast.        Follow-up Information    Follow up with Primary care doctor or obstetrician. In 2 weeks.          The results of significant diagnostics from this hospitalization (including imaging, microbiology, ancillary and laboratory) are listed below for reference.    Significant  Diagnostic Studies: Dg Chest 2 View  10/01/2012  *RADIOLOGY REPORT*  Clinical Data: Cough, fever, shortness of breath  CHEST - 2 VIEW  Comparison: None.  Findings: Mild thoracic dextroscoliosis without evident underlying vertebral anomaly. Lungs clear.  Heart size and pulmonary vascularity normal.  No effusion.  IMPRESSION: No acute disease   Original Report Authenticated By: D. Andria Rhein, MD     Microbiology: No results found for this or any previous visit (from the past 240  hour(s)).   Labs: Basic Metabolic Panel:  Lab 10/03/12 1610 10/02/12 1205 10/02/12 0421 10/01/12 2233  NA 135 135 136 137  K 3.1* 3.2* 3.4* 3.4*  CL 101 105 106 103  CO2 26 19 17* --  GLUCOSE 90 156* 164* 100*  BUN 3* <3* <3* <3*  CREATININE 0.44* 0.39* 0.39* 0.70  CALCIUM 9.2 9.4 9.2 --  MG -- -- -- --  PHOS -- -- -- --   Liver Function Tests: No results found for this basename: AST:5,ALT:5,ALKPHOS:5,BILITOT:5,PROT:5,ALBUMIN:5 in the last 168 hours No results found for this basename: LIPASE:5,AMYLASE:5 in the last 168 hours No results found for this basename: AMMONIA:5 in the last 168 hours CBC:  Lab 10/03/12 0540 10/02/12 0421 10/01/12 2233 10/01/12 2220  WBC 6.7 8.3 -- 11.1*  NEUTROABS -- -- -- 9.9*  HGB 10.9* 11.2* 14.6 13.8  HCT 32.3* 33.0* 43.0 38.8  MCV 83.5 83.1 -- 82.6  PLT 282 291 -- 306   Cardiac Enzymes: No results found for this basename: CKTOTAL:5,CKMB:5,CKMBINDEX:5,TROPONINI:5 in the last 168 hours BNP: BNP (last 3 results) No results found for this basename: PROBNP:3 in the last 8760 hours CBG: No results found for this basename: GLUCAP:5 in the last 168 hours  Time coordinating discharge: 35 minutes  Signed:  Shalin Linders  [facility] 10/03/2012, 7:58 AM

## 2012-10-22 ENCOUNTER — Ambulatory Visit: Payer: 59 | Admitting: Obstetrics and Gynecology

## 2012-10-22 DIAGNOSIS — Z331 Pregnant state, incidental: Secondary | ICD-10-CM

## 2012-10-22 LAB — POCT URINALYSIS DIPSTICK
Bilirubin, UA: NEGATIVE
Glucose, UA: NEGATIVE
Nitrite, UA: NEGATIVE
pH, UA: 7

## 2012-10-22 NOTE — Progress Notes (Signed)
NOB interview. Hospitalized at [facility] (under name Donzetta Kohut)  with influenza 09/2012.Had  chest X-ray and course of steroids. NOB W/U scheduled with SL 11/03/12.

## 2012-10-23 LAB — PRENATAL PANEL VII
Antibody Screen: NEGATIVE
Basophils Absolute: 0 10*3/uL (ref 0.0–0.1)
Basophils Relative: 0 % (ref 0–1)
Eosinophils Absolute: 0.1 10*3/uL (ref 0.0–0.7)
Eosinophils Relative: 1 % (ref 0–5)
HCT: 37.5 % (ref 36.0–46.0)
Hemoglobin: 12.9 g/dL (ref 12.0–15.0)
MCH: 28.6 pg (ref 26.0–34.0)
MCHC: 34.4 g/dL (ref 30.0–36.0)
Monocytes Absolute: 0.6 10*3/uL (ref 0.1–1.0)
Monocytes Relative: 8 % (ref 3–12)
RDW: 14 % (ref 11.5–15.5)
Rh Type: POSITIVE

## 2012-10-23 LAB — VARICELLA ZOSTER ANTIBODY, IGM: Varicella Zoster Ab IgM: 0.36 {ISR} (ref <0.91)

## 2012-10-23 LAB — GC/CHLAMYDIA PROBE AMP, URINE: Chlamydia, Swab/Urine, PCR: NEGATIVE

## 2012-10-24 LAB — CULTURE, OB URINE: Colony Count: 85000

## 2012-10-26 LAB — HEMOGLOBINOPATHY EVALUATION
Hgb A2 Quant: 3.2 % (ref 2.2–3.2)
Hgb A: 96.8 % (ref 96.8–97.8)

## 2012-11-03 ENCOUNTER — Ambulatory Visit: Payer: 59 | Admitting: Obstetrics and Gynecology

## 2012-11-03 VITALS — BP 110/68 | Ht 65.0 in | Wt 143.0 lb

## 2012-11-03 DIAGNOSIS — Z331 Pregnant state, incidental: Secondary | ICD-10-CM

## 2012-11-03 LAB — POCT WET PREP (WET MOUNT): Clue Cells Wet Prep Whiff POC: NEGATIVE

## 2012-11-03 MED ORDER — TERCONAZOLE 80 MG VA SUPP: 80.0000 mg | Freq: Every day | VAGINAL | Status: DC

## 2012-11-03 NOTE — Progress Notes (Signed)
Pt is here today for her NOB work-up . Last pap was 05/2011 wnl

## 2012-11-05 LAB — PAP IG W/ RFLX HPV ASCU

## 2012-11-10 ENCOUNTER — Encounter: Payer: Self-pay | Admitting: Obstetrics and Gynecology

## 2012-11-11 ENCOUNTER — Other Ambulatory Visit: Payer: Self-pay | Admitting: Obstetrics and Gynecology

## 2012-11-11 MED ORDER — DOXYLAMINE-PYRIDOXINE 10-10 MG PO TBEC: 2.0000 | DELAYED_RELEASE_TABLET | Freq: Every day | ORAL | Status: DC

## 2012-11-16 NOTE — Progress Notes (Signed)
Patient ID: Whitney Silva, female   DOB: [birth year], 32 y.o.   MRN: [id number] Subjective:    Whitney Silva is being seen today for her first obstetrical visit. 32 y.o.    She is [redacted]w[redacted]d determined by: Patient's last menstrual period was 07/12/2012..    Relationship w FOB: married  She denies any vag bleeding, cramping, or discharge.  She denies nausea/vomiting.   Her obstetrical history is significant for: PT was hospitalized at [facility] in January for the flu     Review of Systems Pertinent ROS is described in HPI   Objective:   BP 110/68  Ht 5\' 5"  (1.651 m)  Wt 143 lb (64.864 kg)  BMI 23.8 kg/m2  LMP 07/12/2012 Wt Readings from Last 1 Encounters:  11/03/12 143 lb (64.864 kg)   BMI: Body mass index is 23.8 kg/(m^2).  General: alert, cooperative and no distress HEENT: grossly normal  Thyroid: normal  Respiratory: clear to auscultation bilaterally Cardiovascular: regular rate and rhythm  Breasts:  No dominant masses, nipples erect Gastrointestinal: soft, non-tender; no masses,  no organomegaly Extremities: extremities normal, no pain or edema   EXTERNAL GENITALIA: normal appearing vulva with no masses, tenderness or lesions VAGINA: no abnormal discharge, bleeding or lesions CERVIX: no lesions or cervical motion tenderness; cervix closed, long, firm UTERUS: gravid and consistent with 16 weeks ADNEXA: no masses palpable and nontender OB EXAM PELVIMETRY: appears adequate      Assessment:    Pregnancy at   Plan:     Prenatal labs rv'd  Pap smear collected:  yes GC/Chlamydia collected:  No - neg at NOB interview  Wet prep:  +yeast - RX terazol  Discussion of Genetic testing options: declines Problem list reviewed and updated. rv'd how and when to call for emergencies rv'd practice routines Discussed nutrition and exercise and common pregnancy discomforts   F/u 3wks for anatomy scan     Sanda Klein, CNM

## 2012-11-20 ENCOUNTER — Telehealth: Payer: Self-pay | Admitting: Obstetrics and Gynecology

## 2012-11-20 NOTE — Telephone Encounter (Signed)
TC to pt in  Response to E-mail. States was not current. Received RX and is feeling better.

## 2012-12-03 ENCOUNTER — Encounter: Payer: Self-pay | Admitting: Obstetrics and Gynecology

## 2012-12-03 ENCOUNTER — Ambulatory Visit: Payer: 59 | Admitting: Obstetrics and Gynecology

## 2012-12-03 ENCOUNTER — Ambulatory Visit: Payer: 59

## 2012-12-03 VITALS — BP 100/60 | Wt 146.5 lb

## 2012-12-03 DIAGNOSIS — O09291 Supervision of pregnancy with other poor reproductive or obstetric history, first trimester: Secondary | ICD-10-CM

## 2012-12-03 DIAGNOSIS — O09299 Supervision of pregnancy with other poor reproductive or obstetric history, unspecified trimester: Secondary | ICD-10-CM | POA: Insufficient documentation

## 2012-12-03 DIAGNOSIS — Z331 Pregnant state, incidental: Secondary | ICD-10-CM

## 2012-12-03 DIAGNOSIS — J111 Influenza due to unidentified influenza virus with other respiratory manifestations: Secondary | ICD-10-CM | POA: Insufficient documentation

## 2012-12-03 LAB — US OB DETAIL + 14 WK

## 2012-12-03 NOTE — Progress Notes (Signed)
Doing well--slight cold, but resolving. Reviewed US--anterior placenta, reviewed impact on diminished perception of FM until baby a little larger. Declined genetic testing. Patient from [place] since 2004, husband just moved here from [place] (from [place] as well--working with Marcina Millard).

## 2012-12-03 NOTE — Progress Notes (Signed)
Pt stated no issues today.  [redacted]w[redacted]d Ultrasound shows:  SIUP  S=D     [place] EDD: 04/18/2013            Cervical length: 4.38 cm           Placenta localization: anterior           Fetal presentation: breech                    Anatomy survey is normal           Gender : female Comments: Placenta edge is 5.3 cm from inter

## 2012-12-14 ENCOUNTER — Other Ambulatory Visit: Payer: Self-pay | Admitting: Obstetrics and Gynecology

## 2013-03-25 ENCOUNTER — Encounter (HOSPITAL_COMMUNITY): Payer: Self-pay | Admitting: *Deleted

## 2013-03-25 ENCOUNTER — Inpatient Hospital Stay (HOSPITAL_COMMUNITY)
Admission: AD | Admit: 2013-03-25 | Discharge: 2013-03-29 | DRG: 774 | Disposition: A | Discharge status: Home / Self Care | Payer: 59 | Source: Ambulatory Visit | Attending: Obstetrics and Gynecology | Admitting: Obstetrics and Gynecology

## 2013-03-25 DIAGNOSIS — O1490 Unspecified pre-eclampsia, unspecified trimester: Secondary | ICD-10-CM | POA: Diagnosis present

## 2013-03-25 DIAGNOSIS — O1493 Unspecified pre-eclampsia, third trimester: Secondary | ICD-10-CM

## 2013-03-25 DIAGNOSIS — Z8759 Personal history of other complications of pregnancy, childbirth and the puerperium: Secondary | ICD-10-CM

## 2013-03-25 DIAGNOSIS — IMO0002 Reserved for concepts with insufficient information to code with codable children: Secondary | ICD-10-CM | POA: Diagnosis present

## 2013-03-25 DIAGNOSIS — Z3480 Encounter for supervision of other normal pregnancy, unspecified trimester: Secondary | ICD-10-CM

## 2013-03-25 LAB — URINALYSIS, ROUTINE W REFLEX MICROSCOPIC
Bilirubin Urine: NEGATIVE
Ketones, ur: NEGATIVE mg/dL
Nitrite: NEGATIVE
Protein, ur: 30 mg/dL — AB
pH: 6 (ref 5.0–8.0)

## 2013-03-25 LAB — COMPREHENSIVE METABOLIC PANEL
Albumin: 2.7 g/dL — ABNORMAL LOW (ref 3.5–5.2)
BUN: 5 mg/dL — ABNORMAL LOW (ref 6–23)
Calcium: 9.7 mg/dL (ref 8.4–10.5)
GFR calc Af Amer: >90 mL/min (ref >90)
Glucose, Bld: 79 mg/dL (ref 70–99)
Sodium: 135 mEq/L (ref 135–145)
Total Protein: 6.5 g/dL (ref 6.0–8.3)

## 2013-03-25 LAB — LACTATE DEHYDROGENASE: LDH: 167 U/L (ref 94–250)

## 2013-03-25 LAB — CBC
HCT: 35.1 % — ABNORMAL LOW (ref 36.0–46.0)
Hemoglobin: 11.7 g/dL — ABNORMAL LOW (ref 12.0–15.0)
MCH: 26.5 pg (ref 26.0–34.0)
MCHC: 33.3 g/dL (ref 30.0–36.0)
RDW: 15 % (ref 11.5–15.5)

## 2013-03-25 LAB — PROTEIN / CREATININE RATIO, URINE
Creatinine, Urine: 35.28 mg/dL
Total Protein, Urine: 40.7 mg/dL

## 2013-03-25 MED ORDER — LABETALOL HCL 100 MG PO TABS
200.0000 mg | ORAL_TABLET | Freq: Once | ORAL | Status: AC
  Administered 2013-03-25: 200 mg via ORAL
  Filled 2013-03-25: qty 2

## 2013-03-25 MED ORDER — LACTATED RINGERS IV SOLN
INTRAVENOUS | Status: DC
  Administered 2013-03-25: 125 mL/h via INTRAVENOUS
  Administered 2013-03-26: 14:00:00 via INTRAVENOUS

## 2013-03-25 NOTE — H&P (Signed)
Whitney Silva is a 32 y.o. female presenting for PIH w/u, Pt is [redacted]w[redacted]d seen at office today w elevated BP and hx HA last 2 days. BP on admission 150's/90'-100, PIH labs are normal, UA protein/creat ratio 1.15.   HPI: Pt began [facility] at [facility] at 16wks, hospitalized at [facility] in January for H1N1 [facility] = 04/18/13 based on LMP of 10/27 Anatomy US at 20wks WNL 1hr gtt at 28wks WNL   Maternal Medical History:  Reason for admission: Preeclampsia, IOL   Fetal activity: Perceived fetal activity is normal.   Last perceived fetal movement was within the past hour.    Prenatal complications: Pre-eclampsia.     OB History   Grav Para Term Preterm Abortions TAB SAB Ect Mult Living   2 1 1       1      11/2010 - SVD, 37wks, IOL for pre-eclampsia, epidural,   Past Medical History  Diagnosis Date  . Asthma   . Pregnancy induced hypertension    Past Surgical History  Procedure Laterality Date  . Ovarian cyst surgery     Family History: non-contributory  Social History:  reports that she has never smoked. She does not have any smokeless tobacco history on file. She reports that she does not drink alcohol or use illicit drugs.   Prenatal Transfer Tool  Maternal Diabetes: No Genetic Screening: Declined Maternal Ultrasounds/Referrals: Normal Fetal Ultrasounds or other Referrals:  None Maternal Substance Abuse:  No Significant Maternal Medications:  None Significant Maternal Lab Results:  None Other Comments:  None  Review of Systems  HENT:       Woke up w HA, last 2 days, relieved w tylenol, no HA now   Eyes: Negative for blurred vision.  Cardiovascular: Negative for chest pain.  Gastrointestinal: Negative for heartburn, vomiting and abdominal pain.  Genitourinary: Negative for dysuria.  Neurological: Positive for headaches.  All other systems reviewed and are negative.      Blood pressure 157/99, pulse 71, temperature 98.1 F (36.7 C), temperature source Oral, resp. rate 18, height 5'  5" (1.651 m), weight 165 lb (74.844 kg), last menstrual period 07/12/2012, SpO2 100.00%. Maternal Exam:  Abdomen: Patient reports no abdominal tenderness. Fundal height is aga.   Estimated fetal weight is 5-6#.   Fetal presentation: vertex vtx per leopolds, will confirm w [place]   Introitus: not evaluated.   VE at office cl/th/high  Pelvis: adequate for delivery.   Cervix: not evaluated.   Fetal Exam Fetal Monitor Review: Mode: ultrasound.   Baseline rate: 130.  Variability: moderate (6-25 bpm).   Pattern: accelerations present and no decelerations.    Fetal State Assessment: Category I - tracings are normal.     Physical Exam  Nursing note and vitals reviewed. Constitutional: She is oriented to person, place, and time. She appears well-developed and well-nourished. No distress.  HENT:  Head: Normocephalic.  Eyes: Pupils are equal, round, and reactive to light.  Neck: Normal range of motion.  Cardiovascular: Normal rate, regular rhythm and normal heart sounds.   Respiratory: Effort normal and breath sounds normal.  GI: Soft. Bowel sounds are normal.  Genitourinary: Vagina normal.  Musculoskeletal: Normal range of motion.  Neurological: She is alert and oriented to person, place, and time. She has normal reflexes.  Skin: Skin is warm and dry.  Psychiatric: She has a normal mood and affect. Her behavior is normal.    Prenatal labs: ABO, Rh:   Bpos Antibody:  neg Rubella:  Imm RPR:  NR HBsAg:   neg HIV:   neg GBS:   pending from 7-10  Varicella - non immune hgb electrophoresis - WNL Pap smear - WNL +yeast UA cx - neg 1hr gtt 129, hgb 10.9, RPR NR  Assessment/Plan: IUP at [redacted]w[redacted]d pre-eclampsia GBS pending - done at office today Abnormal thyroid function - free T3, free T4 pending from Dr Talmage Nap on 7-9  Admit to b.s per c/w Dr Pennie Rushing Will get Korea to confirm vtx position Routine IOL orders cytotec q4 PV Continue labetalol 200mg  PO BID - rcv'd 1st dose at  about 10pm IV labetalol if BP >160/105 mag sulfate in active labor    Dillan Candela M 03/25/2013, 11:14 PM

## 2013-03-25 NOTE — MAU Note (Signed)
Per Gevena Barre CNM, pt may come off fetal monitor.

## 2013-03-25 NOTE — MAU Provider Note (Signed)
History     CSN: [id number]  Arrival date and time: 03/25/13 4098   None     Chief Complaint  Patient presents with  . Hypertension   HPI Comments: Pt is a G2P1 at [redacted]w[redacted]d that arrived after being seen at office today and had elevated BP's. Pt currently denies HA/N/V/RUQ pain, no ctx, VB or LOF, GFM. Pt had HA yesterday and today in the AM and took tylenol w relief, reports has been having more swelling in hands and feet, tho somewhat improved today. Pt states she has hx pre-eclampsia in prior pregnancy and was induced at 37wks and also received mag sulfate.   Hypertension Associated symptoms include headaches. Pertinent negatives include no blurred vision, chest pain or shortness of breath.      Past Medical History  Diagnosis Date  . Asthma   . Pregnancy induced hypertension     Past Surgical History  Procedure Laterality Date  . Ovarian cyst surgery      History reviewed. No pertinent family history.  History  Substance Use Topics  . Smoking status: Never Smoker   . Smokeless tobacco: Not on file  . Alcohol Use: No    Allergies:  Allergies  Allergen Reactions  . Nsaids Hives  . Aspirin Hives and Rash    Prescriptions prior to admission  Medication Sig Dispense Refill  . acetaminophen (TYLENOL) 500 MG tablet Take 1,000 mg by mouth every 6 (six) hours as needed for pain (For headache.).      Marland Kitchen Doxylamine-Pyridoxine (DICLEGIS) 10-10 MG TBEC Take 1 tablet by mouth daily.      . Prenatal Vit-Fe Fumarate-FA (PRENATAL MULTIVITAMIN) TABS Take 1 tablet by mouth at bedtime.      . ranitidine (ZANTAC) 150 MG tablet Take 150 mg by mouth daily.      Marland Kitchen albuterol (PROVENTIL HFA;VENTOLIN HFA) 108 (90 BASE) MCG/ACT inhaler Inhale 2 puffs into the lungs every 6 (six) hours as needed for wheezing.  1 Inhaler  0    Review of Systems  Eyes: Negative for blurred vision and double vision.  Respiratory: Negative for shortness of breath.   Cardiovascular: Positive for leg  swelling. Negative for chest pain.  Gastrointestinal: Negative for heartburn, nausea, vomiting and abdominal pain.  Genitourinary: Negative for dysuria.  Neurological: Positive for headaches.  All other systems reviewed and are negative.   Physical Exam   Blood pressure 157/98, pulse 96, temperature 98.1 F (36.7 C), temperature source Oral, resp. rate 18, height 5\' 5"  (1.651 m), weight 165 lb (74.844 kg), last menstrual period 07/12/2012, SpO2 100.00%.  Physical Exam  Nursing note and vitals reviewed. Constitutional: She is oriented to person, place, and time. She appears well-developed and well-nourished.  HENT:  Head: Normocephalic.  Eyes: Pupils are equal, round, and reactive to light.  Neck: Normal range of motion.  Cardiovascular: Normal rate, regular rhythm and normal heart sounds.   Respiratory: Effort normal and breath sounds normal.  GI: Soft. Bowel sounds are normal.  Genitourinary:  Deferred   Musculoskeletal: Normal range of motion. She exhibits no edema.  Neurological: She is alert and oriented to person, place, and time. She has normal reflexes. She displays normal reflexes.  Skin: Skin is warm and dry.  Psychiatric: She has a normal mood and affect. Her behavior is normal.   CBC essentially normal UA +1 protein and +micro for bacteria  MAU Course  Procedures    Assessment and Plan  IUP at [redacted]w[redacted]d Elevated BP Gestational htn vs. pre-eclampsia Labs  pending and UA for prot/creat ratio UA cx sent Will d/w Dr Pennie Rushing when labs returned    Kassondra Geil M 03/25/2013, 8:32 PM

## 2013-03-25 NOTE — MAU Note (Signed)
Pt reports her blood pressure was up in office today and she was told to come here for further eval . Pt reports headache today.

## 2013-03-26 ENCOUNTER — Inpatient Hospital Stay (HOSPITAL_COMMUNITY): Payer: 59

## 2013-03-26 ENCOUNTER — Encounter (HOSPITAL_COMMUNITY): Payer: Self-pay | Admitting: *Deleted

## 2013-03-26 DIAGNOSIS — Z8759 Personal history of other complications of pregnancy, childbirth and the puerperium: Secondary | ICD-10-CM

## 2013-03-26 DIAGNOSIS — O1490 Unspecified pre-eclampsia, unspecified trimester: Secondary | ICD-10-CM | POA: Diagnosis present

## 2013-03-26 DIAGNOSIS — Z3480 Encounter for supervision of other normal pregnancy, unspecified trimester: Secondary | ICD-10-CM

## 2013-03-26 LAB — ABO/RH: ABO/RH(D): B POS

## 2013-03-26 LAB — OB RESULTS CONSOLE ABO/RH
RH Type: POSITIVE
RH Type: POSITIVE

## 2013-03-26 LAB — OB RESULTS CONSOLE HIV ANTIBODY (ROUTINE TESTING): HIV: NONREACTIVE

## 2013-03-26 LAB — URINE CULTURE: Colony Count: 30000

## 2013-03-26 LAB — CBC
Hemoglobin: 13.8 g/dL (ref 12.0–15.0)
MCH: 27.2 pg (ref 26.0–34.0)
MCHC: 34.1 g/dL (ref 30.0–36.0)

## 2013-03-26 LAB — TYPE AND SCREEN
ABO/RH(D): B POS
Antibody Screen: NEGATIVE

## 2013-03-26 LAB — RPR: RPR Ser Ql: NONREACTIVE

## 2013-03-26 LAB — MRSA PCR SCREENING: MRSA by PCR: NEGATIVE

## 2013-03-26 MED ORDER — SIMETHICONE 80 MG PO CHEW: 80.0000 mg | CHEWABLE_TABLET | ORAL | Status: DC | PRN

## 2013-03-26 MED ORDER — DIPHENHYDRAMINE HCL 25 MG PO CAPS: 25.0000 mg | ORAL_CAPSULE | Freq: Four times a day (QID) | ORAL | Status: DC | PRN

## 2013-03-26 MED ORDER — OXYTOCIN 40 UNITS IN LACTATED RINGERS INFUSION - SIMPLE MED
1.0000 m[IU]/min | INTRAVENOUS | Status: DC
  Administered 2013-03-26: 1 m[IU]/min via INTRAVENOUS
  Administered 2013-03-26: 2 m[IU]/min via INTRAVENOUS
  Filled 2013-03-26: qty 1000

## 2013-03-26 MED ORDER — DEXTROSE 5 % IV SOLN
5.0000 10*6.[IU] | Freq: Once | INTRAVENOUS | Status: AC
  Administered 2013-03-26: 5 10*6.[IU] via INTRAVENOUS
  Filled 2013-03-26: qty 5

## 2013-03-26 MED ORDER — ONDANSETRON HCL 4 MG/2ML IJ SOLN: 4.0000 mg | INTRAMUSCULAR | Status: DC | PRN

## 2013-03-26 MED ORDER — OXYCODONE-ACETAMINOPHEN 5-325 MG PO TABS
1.0000 | ORAL_TABLET | ORAL | Status: DC | PRN
  Administered 2013-03-26: 1 via ORAL
  Filled 2013-03-26: qty 1

## 2013-03-26 MED ORDER — MAGNESIUM SULFATE 40 G IN LACTATED RINGERS - SIMPLE
2.0000 g/h | INTRAVENOUS | Status: AC
  Administered 2013-03-26 – 2013-03-27 (×2): 2 g/h via INTRAVENOUS
  Filled 2013-03-26 (×2): qty 500

## 2013-03-26 MED ORDER — ONDANSETRON HCL 4 MG PO TABS: 4.0000 mg | ORAL_TABLET | ORAL | Status: DC | PRN

## 2013-03-26 MED ORDER — BENZOCAINE-MENTHOL 20-0.5 % EX AERO
1.0000 "application " | INHALATION_SPRAY | CUTANEOUS | Status: DC | PRN
  Administered 2013-03-26: 1 via TOPICAL
  Filled 2013-03-26 (×2): qty 56

## 2013-03-26 MED ORDER — PRENATAL MULTIVITAMIN CH
1.0000 | ORAL_TABLET | Freq: Every day | ORAL | Status: DC
  Administered 2013-03-27 – 2013-03-29 (×3): 1 via ORAL
  Filled 2013-03-26 (×3): qty 1

## 2013-03-26 MED ORDER — MAGNESIUM SULFATE BOLUS VIA INFUSION
4.0000 g | Freq: Once | INTRAVENOUS | Status: AC
Start: 2013-03-26 — End: 2013-03-26
  Administered 2013-03-26: 4 g via INTRAVENOUS
  Filled 2013-03-26: qty 500

## 2013-03-26 MED ORDER — OXYTOCIN BOLUS FROM INFUSION: 500.0000 mL | INTRAVENOUS | Status: DC

## 2013-03-26 MED ORDER — LIDOCAINE HCL (PF) 1 % IJ SOLN
30.0000 mL | INTRAMUSCULAR | Status: AC | PRN
  Administered 2013-03-26: 30 mL via SUBCUTANEOUS
  Filled 2013-03-26 (×2): qty 30

## 2013-03-26 MED ORDER — MAGNESIUM SULFATE 40 G IN LACTATED RINGERS - SIMPLE: 2.0000 g/h | Freq: Once | INTRAVENOUS | Status: DC

## 2013-03-26 MED ORDER — BUTORPHANOL TARTRATE 1 MG/ML IJ SOLN
1.0000 mg | INTRAMUSCULAR | Status: DC | PRN
  Administered 2013-03-26 (×2): 1 mg via INTRAVENOUS
  Filled 2013-03-26 (×2): qty 1

## 2013-03-26 MED ORDER — ONDANSETRON HCL 4 MG/2ML IJ SOLN: 4.0000 mg | Freq: Four times a day (QID) | INTRAMUSCULAR | Status: DC | PRN

## 2013-03-26 MED ORDER — SENNOSIDES-DOCUSATE SODIUM 8.6-50 MG PO TABS
2.0000 | ORAL_TABLET | Freq: Every day | ORAL | Status: DC
  Administered 2013-03-26 – 2013-03-27 (×2): 2 via ORAL

## 2013-03-26 MED ORDER — ACETAMINOPHEN 325 MG PO TABS
650.0000 mg | ORAL_TABLET | ORAL | Status: DC | PRN
  Administered 2013-03-26: 650 mg via ORAL
  Filled 2013-03-26: qty 2

## 2013-03-26 MED ORDER — WITCH HAZEL-GLYCERIN EX PADS: 1.0000 "application " | MEDICATED_PAD | CUTANEOUS | Status: DC | PRN

## 2013-03-26 MED ORDER — TETANUS-DIPHTH-ACELL PERTUSSIS 5-2.5-18.5 LF-MCG/0.5 IM SUSP
0.5000 mL | Freq: Once | INTRAMUSCULAR | Status: AC
  Administered 2013-03-27: 0.5 mL via INTRAMUSCULAR
  Filled 2013-03-26: qty 0.5

## 2013-03-26 MED ORDER — DIBUCAINE 1 % RE OINT: 1.0000 "application " | TOPICAL_OINTMENT | RECTAL | Status: DC | PRN

## 2013-03-26 MED ORDER — PROMETHAZINE HCL 25 MG/ML IJ SOLN
12.5000 mg | INTRAMUSCULAR | Status: DC | PRN
  Administered 2013-03-26 (×2): 12.5 mg via INTRAVENOUS
  Filled 2013-03-26 (×2): qty 1

## 2013-03-26 MED ORDER — LACTATED RINGERS IV SOLN: 500.0000 mL | INTRAVENOUS | Status: DC | PRN

## 2013-03-26 MED ORDER — CITRIC ACID-SODIUM CITRATE 334-500 MG/5ML PO SOLN
30.0000 mL | ORAL | Status: DC | PRN
  Administered 2013-03-26: 30 mL via ORAL
  Filled 2013-03-26: qty 15

## 2013-03-26 MED ORDER — LABETALOL HCL 5 MG/ML IV SOLN
10.0000 mg | INTRAVENOUS | Status: DC | PRN
  Filled 2013-03-26: qty 4

## 2013-03-26 MED ORDER — PENICILLIN G POTASSIUM 5000000 UNITS IJ SOLR
2.5000 10*6.[IU] | INTRAVENOUS | Status: DC
  Filled 2013-03-26 (×3): qty 2.5

## 2013-03-26 MED ORDER — MAGNESIUM SULFATE 40 MG/ML IJ SOLN: 4.0000 g | Freq: Once | INTRAMUSCULAR | Status: DC

## 2013-03-26 MED ORDER — LACTATED RINGERS IV SOLN
INTRAVENOUS | Status: DC
  Administered 2013-03-26 – 2013-03-27 (×2): via INTRAVENOUS

## 2013-03-26 MED ORDER — LANOLIN HYDROUS EX OINT: TOPICAL_OINTMENT | CUTANEOUS | Status: DC | PRN

## 2013-03-26 MED ORDER — ZOLPIDEM TARTRATE 5 MG PO TABS: 5.0000 mg | ORAL_TABLET | Freq: Every evening | ORAL | Status: DC | PRN

## 2013-03-26 MED ORDER — OXYTOCIN 40 UNITS IN LACTATED RINGERS INFUSION - SIMPLE MED
62.5000 mL/h | INTRAVENOUS | Status: DC
  Administered 2013-03-26: 62.5 mL/h via INTRAVENOUS

## 2013-03-26 MED ORDER — LABETALOL HCL 200 MG PO TABS
200.0000 mg | ORAL_TABLET | Freq: Two times a day (BID) | ORAL | Status: DC
  Administered 2013-03-26 – 2013-03-28 (×6): 200 mg via ORAL
  Filled 2013-03-26 (×10): qty 1

## 2013-03-26 MED ORDER — MISOPROSTOL 25 MCG QUARTER TABLET
25.0000 ug | ORAL_TABLET | ORAL | Status: DC | PRN
  Administered 2013-03-26 (×3): 25 ug via VAGINAL
  Filled 2013-03-26: qty 1
  Filled 2013-03-26 (×4): qty 0.25

## 2013-03-26 MED ORDER — LABETALOL HCL 5 MG/ML IV SOLN: 10.0000 mg | INTRAVENOUS | Status: DC | PRN

## 2013-03-26 MED ORDER — OXYCODONE-ACETAMINOPHEN 5-325 MG PO TABS
1.0000 | ORAL_TABLET | ORAL | Status: DC | PRN
  Administered 2013-03-27 (×3): 1 via ORAL
  Administered 2013-03-27 – 2013-03-28 (×4): 2 via ORAL
  Filled 2013-03-26: qty 2
  Filled 2013-03-26: qty 1
  Filled 2013-03-26: qty 2
  Filled 2013-03-26: qty 1
  Filled 2013-03-26 (×2): qty 2
  Filled 2013-03-26: qty 1

## 2013-03-26 MED ORDER — ZOLPIDEM TARTRATE 5 MG PO TABS
5.0000 mg | ORAL_TABLET | Freq: Every evening | ORAL | Status: DC | PRN
  Administered 2013-03-26: 5 mg via ORAL
  Filled 2013-03-26: qty 1

## 2013-03-26 NOTE — Progress Notes (Addendum)
  Subjective: Resting quietly in bed.  Aware of contractions as cramping.  Denies HA, visual sx, or epigastric pain.  Husband at bedside.  Objective: BP 162/94  Pulse 75  Temp(Src) 98.2 F (36.8 C) (Oral)  Resp 18  Ht 5\' 5"  (1.651 m)  Wt 165 lb (74.844 kg)  BMI 27.46 kg/m2  SpO2 100%  LMP 07/12/2012      Filed Vitals:   03/26/13 0601 03/26/13 0624 03/26/13 0753 03/26/13 0800  BP: 153/100 138/78  162/94  Pulse: 80 90  75  Temp: 98.2 F (36.8 C)  98.2 F (36.8 C)   TempSrc: Oral  Oral   Resp: 18  18   Height:      Weight:      SpO2:        FHT:  Category 1 UC:   regular, every 3 minutes SVE:   Dilation: Closed Effacement (%): Thick Station: -3 Exam by:: a. white rn--last exam at 5:45am, with placement of 2nd Cytotech  Assessment / Plan: IUP at 36 5/7 weeks Induction for pre-eclampsia GBS pending  Plan: Re-evaluate for repeat Cytotech, etc., at 9:45am or prn Labetalol 200 po BID Anticipate starting magnesium sulfate with onset of more active labor Will consult regarding plan for GBS prophylaxis--testing done yesterday in the office. Reviewed plan of care with patient and husband--they are agreeable with plan for induction.  [name] 03/26/2013, 8:25 AM

## 2013-03-26 NOTE — Progress Notes (Signed)
  Subjective: Ate light breakfast.  Aware of contractions as moderate cramping.  Objective: BP 147/81  Pulse 83  Temp(Src) 98.2 F (36.8 C) (Oral)  Resp 18  Ht 5\' 5"  (1.651 m)  Wt 165 lb (74.844 kg)  BMI 27.46 kg/m2  SpO2 100%  LMP 07/12/2012     Filed Vitals:   03/26/13 0753 03/26/13 0800 03/26/13 0901 03/26/13 0950  BP:  162/94 147/81   Pulse:  75 83   Temp: 98.2 F (36.8 C)     TempSrc: Oral     Resp: 18   18  Height:      Weight:      SpO2:        FHT:  Category 1 UC:   irregular, every 2-5 minutes SVE:   Dilation: Closed Effacement (%): 50 Station: -2 Exam by:: Manfred Arch CNM Cervix softer Cytotech #3 placed in posterior fornix  Assessment / Plan: Induction of labor for pre-eclampsia Will evaluate at 2pm for next step in induction--if cervix improved, will consider pitocin.  Nigel Bridgeman 03/26/2013, 9:53 AM

## 2013-03-26 NOTE — Progress Notes (Signed)
Patient placed on bedpan. Patient remains groggy from medication.   Unable to void in bedpan.

## 2013-03-26 NOTE — Progress Notes (Signed)
  Subjective: Crying out with contractions, sleeping between.  Received Stadol and Phenergan at 4:44pm with benefit.  Objective: BP 138/82  Pulse 88  Temp(Src) 98.2 F (36.8 C) (Oral)  Resp 20  Ht 5\' 5"  (1.651 m)  Wt 165 lb (74.844 kg)  BMI 27.46 kg/m2  SpO2 98%  LMP 07/12/2012   Total I/O In: 425 [I.V.:175; IV Piggyback:250] Out: 1150 [Urine:1150]  Filed Vitals:   03/26/13 1701 03/26/13 1727 03/26/13 1731 03/26/13 1732  BP: 133/72  138/82   Pulse: 87 82 92 88  Temp:      TempSrc:      Resp: 18  20   Height:      Weight:      SpO2:  96%  98%   Most recent BP 170/104, taken with UC and just after getting up to BR.  FHT:  Category 2--baseline 110s after Stadol, no decels. UC:   regular, every 2 minutes SVE:   Dilation: 3 Effacement (%): 80 Station: -1 Exam by:: Emilee Hero CNM Pitocin off since 4:38p  Assessment / Plan: Induction for pre-eclampsia--progressive labor Anticipate epidural soon Repeat CBC now  Kamila Broda 03/26/2013, 5:51 PM

## 2013-03-26 NOTE — Progress Notes (Signed)
  Subjective: Requesting pain medication.  Breathing with contractions, partner supportive at bedside.  Objective: BP 132/74  Pulse 88  Temp(Src) 98.2 F (36.8 C) (Oral)  Resp 18  Ht 5\' 5"  (1.651 m)  Wt 165 lb (74.844 kg)  BMI 27.46 kg/m2  SpO2 100%  LMP 07/12/2012   Total I/O In: 425 [I.V.:175; IV Piggyback:250] Out: -  Filed Vitals:   03/26/13 1530 03/26/13 1546 03/26/13 1601 03/26/13 1630  BP: 123/80 127/68 132/74 135/85  Pulse: 93 89 88 91  Temp:      TempSrc:      Resp: 18  18 18   Height:      Weight:      SpO2:         FHT:  Category 2--series of mild variables with contractions. UC:   q 1-2 min, moderate Pitocin on 2 mu/min SVE:   Dilation: 1.5 Effacement (%): 80 Station: -1 Exam by:: Manfred Arch, CNM BBOW noted.  Magnesium bolus started at 2:54p Received 1st dose PCN at 3p Pitocin begun at 3p  Assessment / Plan: Progressive labor Induction for pre-eclampsia GBS pending--on PCN prophylaxis. Will CTO IV pain medication now--anticipate epidural soon.  Nigel Bridgeman 03/26/2013, 4:48 PM

## 2013-03-26 NOTE — Progress Notes (Signed)
  Subjective: Breathing with contractions, requesting pain medication.  Denies HA, visual sx, epigastric pain.  Objective: BP 142/91  Pulse 88  Temp(Src) 98.2 F (36.8 C) (Oral)  Resp 18  Ht 5\' 5"  (1.651 m)  Wt 165 lb (74.844 kg)  BMI 27.46 kg/m2  SpO2 100%  LMP 07/12/2012      FHT: Category  UC:   irregular, every 2-5 minutes SVE:   Dilation: Fingertip Effacement (%): 50 Station: -2 Exam by:: Manfred Arch CNM Cervix soft.  Assessment / Plan: Induction of labor due to pre-eclampsia Unknown GBS--pending from 03/25/13  Plan: Stadol and Phenergan for pain Start Pitocin per low dose protocol--will defer foley per patient request. Start magnesium sulfate per protocol Start GBS prophylaxis with PCN G per standard dosing. Epidural prn.  Nigel Bridgeman 03/26/2013, 2:31 PM

## 2013-03-27 LAB — CBC
Hemoglobin: 11.4 g/dL — ABNORMAL LOW (ref 12.0–15.0)
MCH: 26.5 pg (ref 26.0–34.0)
MCHC: 33.2 g/dL (ref 30.0–36.0)
MCV: 79.8 fL (ref 78.0–100.0)
Platelets: 241 10*3/uL (ref 150–400)

## 2013-03-27 LAB — COMPREHENSIVE METABOLIC PANEL
ALT: 10 U/L (ref 0–35)
Calcium: 8.3 mg/dL — ABNORMAL LOW (ref 8.4–10.5)
Creatinine, Ser: 0.51 mg/dL (ref 0.50–1.10)
GFR calc Af Amer: >90 mL/min (ref >90)
GFR calc non Af Amer: >90 mL/min (ref >90)
Glucose, Bld: 105 mg/dL — ABNORMAL HIGH (ref 70–99)
Sodium: 134 mEq/L — ABNORMAL LOW (ref 135–145)
Total Protein: 5.8 g/dL — ABNORMAL LOW (ref 6.0–8.3)

## 2013-03-27 LAB — URIC ACID: Uric Acid, Serum: 5.6 mg/dL (ref 2.4–7.0)

## 2013-03-27 MED ORDER — PNEUMOCOCCAL VAC POLYVALENT 25 MCG/0.5ML IJ INJ
0.5000 mL | INJECTION | INTRAMUSCULAR | Status: AC
Start: 2013-03-28 — End: 2013-03-28
  Administered 2013-03-28: 0.5 mL via INTRAMUSCULAR
  Filled 2013-03-27: qty 0.5

## 2013-03-27 NOTE — Lactation Note (Signed)
This note was copied from the chart of Whitney Silva. Lactation Consultation Note    Initial consult with this mom of a now 37 6/[redacted] week gestation baby, at 26 hours of age. Mom in AICU. Mom is an experienced breast feeder, and reports this baby is feeding well Mom denies questions/discomforst. On exam, she has evert nipples and easily expressed colostrum. Lactation folder left and reviowed with mom.Mom knows to call for questions/concerns.  Patient Name: Whitney Silva [id number] Date: 03/27/2013 Reason for consult: Initial assessment   Maternal Data Formula Feeding for Exclusion: Yes Reason for exclusion: Admission to Intensive Care Unit (ICU) post-partum Infant to breast within first hour of birth: Yes Has patient been taught Hand Expression?: Yes Does the patient have breastfeeding experience prior to this delivery?: Yes  Feeding    LATCH Score/Interventions                      Lactation Tools Discussed/Used     Consult Status Consult Status: Complete Follow-up type: Call as needed    Alfred Levins 03/27/2013, 9:15 AM

## 2013-03-27 NOTE — Progress Notes (Addendum)
Post Partum Day 1  Subjective: Reports feeling OK.  States she has a mild headache this AM.  Denies vision chgs, RUQ pain and reports edema has decreased.  Ambulating, voiding and tol po liquids and solids without difficulty.  Pos flatus, neg BM.  Working on breastfeeding and baby is in room with patient as well as significant other.    Objective: Blood pressure 147/84, pulse 93, temperature 98.3 F (36.8 C), temperature source Oral, resp. rate 16, height 5\' 5"  (1.651 m), weight 70.897 kg (156 lb 4.8 oz), last menstrual period 07/12/2012, SpO2 100.00%, unknown if currently breastfeeding. Filed Vitals:   03/27/13 0508 03/27/13 0600 03/27/13 0700 03/27/13 0800  BP:  133/82 139/83 147/84  Pulse: 85 76 80 93  Temp:    98.3 F (36.8 C)  TempSrc:    Oral  Resp:    16  Height:      Weight: 70.897 kg (156 lb 4.8 oz)     SpO2: 98% 98% 98% 100%  No elevated blood pressures since 0000 03/27/13.  Received labetalol IV x 1 at 2200 03/26/13.    I/O last 3 completed shifts: In: 4385 [P.O.:1390; I.V.:2745; IV Piggyback:250] Out: 4450 [Urine:4200; Blood:250] Total I/O In: 485 [P.O.:360; I.V.:125] Out: 1000 [Urine:1000]   Physical Exam:  General: alert, cooperative and no distress Heart:  RRR Lungs:  CTA bilat Abd:  Soft, NT with pos BS x 4 quads Lochia: appropriate, sm rubra Uterine Fundus: firm, NT at umb Incision: Perineum intact DVT Evaluation: No evidence of DVT seen on physical exam. Neg Homan's bilat. No significant calf/ankle edema. DTRs 1+, no clonus.   Results for orders placed during the hospital encounter of 03/25/13 (from the past 24 hour(s))  CBC     Status: Abnormal   Collection Time    03/26/13  6:08 PM      Result Value Range   WBC 10.8 (*) 4.0 - 10.5 K/uL   RBC 5.08  3.87 - 5.11 MIL/uL   Hemoglobin 13.8  12.0 - 15.0 g/dL   HCT 81.1  91.4 - 78.2 %   MCV 79.7  78.0 - 100.0 fL   MCH 27.2  26.0 - 34.0 pg   MCHC 34.1  30.0 - 36.0 g/dL   RDW 95.6  21.3 - 08.6 %   Platelets 270  150 - 400 K/uL  MRSA PCR SCREENING     Status: None   Collection Time    03/26/13  9:20 PM      Result Value Range   MRSA by PCR NEGATIVE  NEGATIVE  CBC     Status: Abnormal   Collection Time    03/27/13  5:55 AM      Result Value Range   WBC 12.6 (*) 4.0 - 10.5 K/uL   RBC 4.30  3.87 - 5.11 MIL/uL   Hemoglobin 11.4 (*) 12.0 - 15.0 g/dL   HCT 57.8 (*) 46.9 - 62.9 %   MCV 79.8  78.0 - 100.0 fL   MCH 26.5  26.0 - 34.0 pg   MCHC 33.2  30.0 - 36.0 g/dL   RDW 52.8  41.3 - 24.4 %   Platelets 241  150 - 400 K/uL  COMPREHENSIVE METABOLIC PANEL     Status: Abnormal   Collection Time    03/27/13  5:55 AM      Result Value Range   Sodium 134 (*) 135 - 145 mEq/L   Potassium 3.6  3.5 - 5.1 mEq/L   Chloride 101  96 -  112 mEq/L   CO2 23  19 - 32 mEq/L   Glucose, Bld 105 (*) 70 - 99 mg/dL   BUN 5 (*) 6 - 23 mg/dL   Creatinine, Ser 4.09  0.50 - 1.10 mg/dL   Calcium 8.3 (*) 8.4 - 10.5 mg/dL   Total Protein 5.8 (*) 6.0 - 8.3 g/dL   Albumin 2.2 (*) 3.5 - 5.2 g/dL   AST 20  0 - 37 U/L   ALT 10  0 - 35 U/L   Alkaline Phosphatase 128 (*) 39 - 117 U/L   Total Bilirubin 0.2 (*) 0.3 - 1.2 mg/dL   GFR calc non Af Amer >90  >90 mL/min   GFR calc Af Amer >90  >90 mL/min  LACTATE DEHYDROGENASE     Status: None   Collection Time    03/27/13  5:55 AM      Result Value Range   LDH 219  94 - 250 U/L  URIC ACID     Status: None   Collection Time    03/27/13  5:55 AM      Result Value Range   Uric Acid, Serum 5.6  2.4 - 7.0 mg/dL    Assessment/Plan: Stable s/p vaginal delivery at 36w 5d Preeclampsia with mild headache  24hr magnesium seizure prophylaxis will be complete at 1830 today. Will treat headache with pain meds and reassess.   Continue to observe carefully.    LOS: 2 days   SMITH,NONA O. 03/27/2013, 9:43 AM   Seen and agreed. Urine output 3000 cc in 10 hours Persistent mild headache improved with tylenol Will d/c MgSO4 at 18:30 Anticipate d/c in am

## 2013-03-28 MED ORDER — LABETALOL HCL 100 MG PO TABS
100.0000 mg | ORAL_TABLET | Freq: Once | ORAL | Status: AC
  Administered 2013-03-28: 100 mg via ORAL
  Filled 2013-03-28: qty 1

## 2013-03-28 MED ORDER — LABETALOL HCL 300 MG PO TABS
300.0000 mg | ORAL_TABLET | Freq: Two times a day (BID) | ORAL | Status: DC
  Administered 2013-03-29: 300 mg via ORAL
  Filled 2013-03-28 (×3): qty 1

## 2013-03-28 NOTE — Progress Notes (Signed)
Post Partum Day 2 Subjective: no complaints, up ad lib, voiding, tolerating PO, + flatus and +BM.  c/o HA mostly relieved with percocet.  NSAID allergy so not taking ibuprofen. Breastfeeding without difficulty.  Objective: Blood pressure 136/75, pulse 89, temperature 98.4 F (36.9 C), temperature source Oral, resp. rate 16, height 5\' 5"  (1.651 m), weight 70.897 kg (156 lb 4.8 oz), last menstrual period 07/12/2012, SpO2 99.00%, unknown if currently breastfeeding.  Physical Exam:  General: alert and no distress Lochia: appropriate Uterine Fundus: firm Incision: n/a DVT Evaluation: No evidence of DVT seen on physical exam. DTR 1-2+, 1 edema   Recent Labs  03/26/13 1808 03/27/13 0555  HGB 13.8 11.4*  HCT 40.5 34.3*    Assessment/Plan: Plan for discharge tomorrow Pt is doing well but had some elevated BPs overnight and this morning.  Mg was d/c'd yesterday evening.  HA improved with percocet.  Will observe overnight. Currently on labetalol 200mg  bid with last BP improved Reviewed contraceptive options Questions answered D/C instructions reviewed as well   LOS: 3 days   Marlyn Tondreau Y 03/28/2013, 2:07 PM

## 2013-03-29 MED ORDER — OXYCODONE-ACETAMINOPHEN 5-325 MG PO TABS: 1.0000 | ORAL_TABLET | ORAL | Status: DC | PRN

## 2013-03-29 MED ORDER — LABETALOL HCL 100 MG PO TABS
100.0000 mg | ORAL_TABLET | Freq: Once | ORAL | Status: AC
  Administered 2013-03-29: 100 mg via ORAL
  Filled 2013-03-29: qty 1

## 2013-03-29 MED ORDER — LABETALOL HCL 200 MG PO TABS: 400.0000 mg | ORAL_TABLET | Freq: Every day | ORAL | Status: DC

## 2013-03-29 NOTE — Discharge Summary (Signed)
  Vaginal Delivery Discharge Summary  Whitney Silva  DOB:    20-Jan-1981 MRN:    [id number] CSN:    [id number]  Date of admission:                  03/25/13  Date of discharge:                   03/29/13  Procedures this admission: SVD with 2nd degree lac  Date of Delivery: 03/26/13 by Nigel Bridgeman  Newborn Data:  Live born female  Birth Weight: 6 lb 1.2 oz (2756 g) APGAR: 8, 9  Home with mother. Name: "Whitney Silva" Circumcision Plan: Inpatient  History of Present Illness:  Ms. Whitney Silva is a 32 y.o. female, G2P1102, who presents at [redacted]w[redacted]d weeks gestation. The patient has been followed at the [facility] and Gynecology division of [facility]. She was admitted induction of labor. Her pregnancy has been complicated by:   Patient Active Problem List   Diagnosis Date Noted  . Preeclampsia 03/26/2013  . History of pre-eclampsia 03/26/2013  . Vaginal delivery 03/26/2013  . Second-degree perineal laceration, with delivery 03/26/2013   Hospital course:  The patient was admitted for IOL for pre-eclampsia.   Her labor was complicated by elevated BP. She proceeded to have a vaginal delivery of a healthy infant. Her delivery was not complicated. Her postpartum course was not complicated.  She was discharged to home on postpartum day 3 doing well.  Feeding:  breast  Contraception:  IUD  Discharge hemoglobin:  Hemoglobin  Date Value Range Status  03/27/2013 11.4* 12.0 - 15.0 g/dL Final     REPEATED TO VERIFY     DELTA CHECK NOTED     HCT  Date Value Range Status  03/27/2013 34.3* 36.0 - 46.0 % Final    Discharge Physical Exam:   General: alert, cooperative and no distress Lochia: appropriate Uterine Fundus: firm Incision: healing well DVT Evaluation: No evidence of DVT seen on physical exam. Negative Homan's sign.  Intrapartum Procedures: spontaneous vaginal delivery Postpartum Procedures: none Complications-Operative  and Postpartum: 2nd degree perineal laceration  Discharge Diagnoses: Term Pregnancy-delivered and Preelampsia  Discharge Information:  Activity:           pelvic rest Diet:                routine Medications: PNV and Percocet Condition:      stable Instructions:  refer to practice specific booklet Discharge to: home  [employer] RN to visit on Wed and call vital signs to [facility] office Return to office in 1 week for BP check Labetalol 400mg  QD  Follow-up Information   Follow up with [facility]. Schedule an appointment as soon as possible for a visit in 1 week. (for blood pressure check.  Call with any questions or concerns.)    Contact information:   [address] [place] [address] [phone]       Haroldine Laws 03/29/2013

## 2013-03-29 NOTE — Progress Notes (Signed)
Post Partum Day 3: S/P SVD with a 2nd degree lac; 24 hours Mag Sulfate  Subjective: Patient up ad lib, denies syncope or dizziness. Denies HA, changes in vision, or right epigastric pain. Feeding:  Breastfeeding Contraceptive plan:   Considering the Mirena  Objective: Blood pressure 141/95, pulse 82, temperature 98.3 F (36.8 C), temperature source Oral, resp. rate 16, height 5\' 5"  (1.651 m), weight 156 lb 4.8 oz (70.897 kg), last menstrual period 07/12/2012, SpO2 100.00%, unknown if currently breastfeeding.  Physical Exam:  General: alert, cooperative and no distress Lochia: appropriate Uterine Fundus: firm Incision: healing well DVT Evaluation: No evidence of DVT seen on physical exam. Negative Homan's sign.   Recent Labs  03/26/13 1808 03/27/13 0555  HGB 13.8 11.4*  HCT 40.5 34.3*    Assessment/Plan: S/P Vaginal delivery day 3  400 mg Labetalol ordered once, recheck BP at 1400 - may discharge later today Continue current care    LOS: 4 days   Whitney Silva 03/29/2013, 10:50 AM

## 2013-04-05 ENCOUNTER — Encounter (HOSPITAL_COMMUNITY): Payer: Self-pay | Admitting: *Deleted

## 2013-07-22 ENCOUNTER — Other Ambulatory Visit: Payer: Self-pay

## 2014-07-18 ENCOUNTER — Encounter (HOSPITAL_COMMUNITY): Payer: Self-pay | Admitting: *Deleted

## 2017-06-19 ENCOUNTER — Emergency Department (HOSPITAL_COMMUNITY): Payer: 59

## 2017-06-19 ENCOUNTER — Encounter (HOSPITAL_COMMUNITY): Payer: Self-pay | Admitting: Emergency Medicine

## 2017-06-19 ENCOUNTER — Emergency Department (HOSPITAL_COMMUNITY)
Admission: EM | Admit: 2017-06-19 | Discharge: 2017-06-20 | Disposition: A | Discharge status: Home / Self Care | Payer: 59 | Attending: Emergency Medicine | Admitting: Emergency Medicine

## 2017-06-19 DIAGNOSIS — J029 Acute pharyngitis, unspecified: Secondary | ICD-10-CM | POA: Insufficient documentation

## 2017-06-19 DIAGNOSIS — R509 Fever, unspecified: Secondary | ICD-10-CM | POA: Diagnosis present

## 2017-06-19 DIAGNOSIS — J45909 Unspecified asthma, uncomplicated: Secondary | ICD-10-CM | POA: Insufficient documentation

## 2017-06-19 DIAGNOSIS — J988 Other specified respiratory disorders: Secondary | ICD-10-CM | POA: Insufficient documentation

## 2017-06-19 DIAGNOSIS — R0981 Nasal congestion: Secondary | ICD-10-CM | POA: Insufficient documentation

## 2017-06-19 DIAGNOSIS — Z79899 Other long term (current) drug therapy: Secondary | ICD-10-CM | POA: Diagnosis not present

## 2017-06-19 DIAGNOSIS — B9789 Other viral agents as the cause of diseases classified elsewhere: Secondary | ICD-10-CM | POA: Insufficient documentation

## 2017-06-19 DIAGNOSIS — R05 Cough: Secondary | ICD-10-CM | POA: Insufficient documentation

## 2017-06-19 LAB — COMPREHENSIVE METABOLIC PANEL
ALT: 83 U/L — AB (ref 14–54)
AST: 40 U/L (ref 15–41)
Albumin: 4.2 g/dL (ref 3.5–5.0)
Alkaline Phosphatase: 90 U/L (ref 38–126)
Anion gap: 10 (ref 5–15)
BUN: 5 mg/dL — ABNORMAL LOW (ref 6–20)
CO2: 21 mmol/L — ABNORMAL LOW (ref 22–32)
Calcium: 9.5 mg/dL (ref 8.9–10.3)
Chloride: 105 mmol/L (ref 101–111)
Creatinine, Ser: 0.61 mg/dL (ref 0.44–1.00)
GFR calc Af Amer: >60 mL/min (ref >60)
GFR calc non Af Amer: >60 mL/min (ref >60)
Glucose, Bld: 99 mg/dL (ref 65–99)
POTASSIUM: 3.2 mmol/L — AB (ref 3.5–5.1)
SODIUM: 136 mmol/L (ref 135–145)
TOTAL PROTEIN: 8.3 g/dL — AB (ref 6.5–8.1)
Total Bilirubin: 0.7 mg/dL (ref 0.3–1.2)

## 2017-06-19 LAB — CBC WITH DIFFERENTIAL/PLATELET
BASOS ABS: 0 10*3/uL (ref 0.0–0.1)
Basophils Relative: 0 %
EOS ABS: 0 10*3/uL (ref 0.0–0.7)
EOS PCT: 0 %
HCT: 33.1 % — ABNORMAL LOW (ref 36.0–46.0)
HEMOGLOBIN: 11 g/dL — AB (ref 12.0–15.0)
LYMPHS PCT: 10 %
Lymphs Abs: 1.4 10*3/uL (ref 0.7–4.0)
MCH: 25.5 pg — ABNORMAL LOW (ref 26.0–34.0)
MCHC: 33.2 g/dL (ref 30.0–36.0)
MCV: 76.6 fL — AB (ref 78.0–100.0)
Monocytes Absolute: 1.8 10*3/uL — ABNORMAL HIGH (ref 0.1–1.0)
Monocytes Relative: 12 %
NEUTROS PCT: 78 %
Neutro Abs: 11.9 10*3/uL — ABNORMAL HIGH (ref 1.7–7.7)
PLATELETS: 362 10*3/uL (ref 150–400)
RBC: 4.32 MIL/uL (ref 3.87–5.11)
RDW: 14.7 % (ref 11.5–15.5)
WBC: 15.2 10*3/uL — AB (ref 4.0–10.5)

## 2017-06-19 LAB — I-STAT CG4 LACTIC ACID, ED: Lactic Acid, Venous: 0.72 mmol/L (ref 0.5–1.9)

## 2017-06-19 NOTE — ED Triage Notes (Signed)
Pt states Tuesday she started having a sore throat and today she started running a fever  Pt states she is achy all over  Pt states she has asthma and has felt short of breath  Pt has used an inhaler and has used her nebulizer at home but continues to feel short of breath  Pt is c/o chest wall pain on the right side

## 2017-06-20 LAB — I-STAT CG4 LACTIC ACID, ED: LACTIC ACID, VENOUS: 1 mmol/L (ref 0.5–1.9)

## 2017-06-20 LAB — URINALYSIS, ROUTINE W REFLEX MICROSCOPIC
Bilirubin Urine: NEGATIVE
Glucose, UA: NEGATIVE mg/dL
Hgb urine dipstick: NEGATIVE
Ketones, ur: 5 mg/dL — AB
Nitrite: NEGATIVE
PROTEIN: NEGATIVE mg/dL
Specific Gravity, Urine: 1.006 (ref 1.005–1.030)
pH: 6 (ref 5.0–8.0)

## 2017-06-20 LAB — D-DIMER, QUANTITATIVE: D-Dimer, Quant: 0.46 ug/mL-FEU (ref 0.00–0.50)

## 2017-06-20 MED ORDER — PREDNISONE 20 MG PO TABS
60.0000 mg | ORAL_TABLET | Freq: Once | ORAL | Status: AC
  Administered 2017-06-20: 60 mg via ORAL
  Filled 2017-06-20: qty 3

## 2017-06-20 MED ORDER — IPRATROPIUM BROMIDE 0.02 % IN SOLN
0.5000 mg | Freq: Once | RESPIRATORY_TRACT | Status: AC
  Administered 2017-06-20: 0.5 mg via RESPIRATORY_TRACT
  Filled 2017-06-20: qty 2.5

## 2017-06-20 MED ORDER — SODIUM CHLORIDE 0.9 % IV BOLUS (SEPSIS)
1000.0000 mL | Freq: Once | INTRAVENOUS | Status: AC
  Administered 2017-06-20: 1000 mL via INTRAVENOUS

## 2017-06-20 MED ORDER — PREDNISONE 20 MG PO TABS: 60.0000 mg | ORAL_TABLET | Freq: Every day | ORAL | 0 refills | Status: DC

## 2017-06-20 MED ORDER — ALBUTEROL SULFATE (2.5 MG/3ML) 0.083% IN NEBU
5.0000 mg | INHALATION_SOLUTION | Freq: Once | RESPIRATORY_TRACT | Status: AC
  Administered 2017-06-20: 5 mg via RESPIRATORY_TRACT
  Filled 2017-06-20: qty 6

## 2017-06-20 MED ORDER — AZITHROMYCIN 250 MG PO TABS: 250.0000 mg | ORAL_TABLET | Freq: Every day | ORAL | 0 refills | Status: DC

## 2017-06-20 NOTE — ED Notes (Signed)
Chris from lab adding on d-dimer

## 2017-06-20 NOTE — ED Notes (Signed)
Requested urine from patient. Patient does not have to urinate at this time. Urine cup with label at bedside.

## 2017-06-20 NOTE — ED Provider Notes (Signed)
[facility]-EMERGENCY DEPT Provider Note   CSN: [id number] Arrival date & time: 06/19/17  2005     History   Chief Complaint Chief Complaint  Patient presents with  . Fever    HPI Whitney Silva is a 36 y.o. female.  Patient with a history of asthma presents for evaluation of fever, cough, congestion, sore throat x 4 days. She reports it started with a scratchy throat and nasal congestion and has progressed to increasing cough and chest tightness which is worse on the right chest, unrelieved with nebulizer treatments and inhaler use. She has generalized body aches. She has a daughter at home with similar illness.    The history is provided by the patient. No language interpreter was used.    Past Medical History:  Diagnosis Date  . Asthma 2014   ALBUTEROL PRN  . Asthma    last attack Jan. 2014  . Headache(784.0) 2011   TOOK RX;  NONE RECENTLY  . Infection 09/2012   INFLUENZA; HOSPITALIZED  . Pregnancy induced hypertension 2012  . Pregnancy induced hypertension     Patient Active Problem List   Diagnosis Date Noted  . Preeclampsia 03/26/2013  . History of pre-eclampsia 03/26/2013  . Vaginal delivery 03/26/2013  . Second-degree perineal laceration, with delivery 03/26/2013  . Hx of preeclampsia, prior pregnancy, currently pregnant 12/03/2012  . Flu 09/2012 12/03/2012    Past Surgical History:  Procedure Laterality Date  . OVARIAN CYST SURGERY    . RIGHT OOPHORECTOMY  2004   OVARIAN CYST    OB History    Gravida Para Term Preterm AB Living   SAB TAB Ectopic Multiple Live Births           3       Home Medications    Prior to Admission medications   Medication Sig Start Date End Date Taking? Authorizing Provider  budesonide-formoterol (SYMBICORT) 80-4.5 MCG/ACT inhaler Inhale 2 puffs into the lungs 2 (two) times daily.   Yes [provider]  labetalol (NORMODYNE) 100 MG tablet Take 100 mg by mouth 2 (two) times daily.   Yes [provider]  SUMAtriptan (IMITREX) 100 MG tablet Take 100 mg by mouth every 2 (two) hours as needed for migraine. May repeat in 2 hours if headache persists or recurs.   Yes [provider]    Family History Family History  Problem Relation Age of Onset  . Early death Mother 49  . Kidney disease Mother   . Glaucoma Father   . Hypertension Father   . Asthma Brother   . Asthma Daughter   . Cancer Paternal Aunt   . Cancer Paternal Uncle        LUNG  . Asthma Brother     Social History Social History  Substance Use Topics  . Smoking status: Never Smoker  . Smokeless tobacco: Never Used  . Alcohol use Yes     Comment: OCC     Allergies   Nsaids; Asa [aspirin]; and Aspirin   Review of Systems Review of Systems  Constitutional: Positive for chills and fever.  HENT: Positive for congestion, rhinorrhea and sore throat. Negative for trouble swallowing.   Respiratory: Positive for cough, chest tightness and shortness of breath.   Cardiovascular: Negative.   Gastrointestinal: Positive for vomiting (She reports post-tussive vomiting only). Negative for abdominal pain and nausea.  Genitourinary: Negative.  Negative for dysuria.  Musculoskeletal: Positive for myalgias.  Skin: Negative.  Negative for rash.  Neurological: Negative.      Physical Exam Updated Vital Signs BP 137/90 (BP Location: Right Arm)   Pulse 92   Temp 99.5 F (37.5 C) (Oral)   Resp 20   Ht  (1.651 m)   Wt 65.8 kg (145 lb)   LMP 06/04/2017 (Exact Date)   SpO2 98%   BMI 24.13 kg/m   Physical Exam  Constitutional: She is oriented to person, place, and time. She appears well-developed and well-nourished.  HENT:  Head: Normocephalic.  Neck: Normal range of motion. Neck supple.  Cardiovascular: Normal rate and regular rhythm.   No murmur heard. Pulmonary/Chest: Effort normal and breath sounds normal. She has no wheezes. She has no rales.  She is moving air well to all fields. No  wheezing.   Abdominal: Soft. Bowel sounds are normal. There is no tenderness. There is no rebound and no guarding.  Musculoskeletal: Normal range of motion. She exhibits no edema.  Neurological: She is alert and oriented to person, place, and time.  Skin: Skin is warm and dry. No rash noted.  Psychiatric: She has a normal mood and affect.     ED Treatments / Results  Labs (all labs ordered are listed, but only abnormal results are displayed) Labs Reviewed  COMPREHENSIVE METABOLIC PANEL - Abnormal; Notable for the following:       Result Value   Potassium 3.2 (*)    CO2 21 (*)    BUN 5 (*)    Total Protein 8.3 (*)    ALT 83 (*)    All other components within normal limits  CBC WITH DIFFERENTIAL/PLATELET - Abnormal; Notable for the following:    WBC 15.2 (*)    Hemoglobin 11.0 (*)    HCT 33.1 (*)    MCV 76.6 (*)    MCH 25.5 (*)    Neutro Abs 11.9 (*)    Monocytes Absolute 1.8 (*)    All other components within normal limits  URINALYSIS, ROUTINE W REFLEX MICROSCOPIC  I-STAT CG4 LACTIC ACID, ED  I-STAT CG4 LACTIC ACID, ED    EKG  EKG Interpretation None       Radiology Dg Chest 2 View  Result Date: 06/19/2017 CLINICAL DATA:  Pt states Tuesday she started having a sore throat and today she started running a fever Pt states she is achy all over Pt states she has asthma and has felt short of breath Pt has used an inhaler and has used her nebulizer at home. Continues to feel short of breath. EXAM: CHEST  2 VIEW COMPARISON:  10/01/2012 FINDINGS: Heart size is upper normal. There are no focal consolidations or pleural effusions. No pulmonary edema. Stable convex right scoliosis of the thoracic spine. IMPRESSION: No evidence for acute  abnormality. Electronically Signed   By: Norva Pavlov M.D.   On: 06/19/2017 21:11    Procedures Procedures (including critical care time)  Medications Ordered in ED Medications  sodium chloride 0.9 % bolus 1,000 mL (1,000 mLs Intravenous  New Bag/Given 06/20/17 0057)  albuterol (PROVENTIL) (2.5 MG/3ML) 0.083% nebulizer solution 5 mg (5 mg Nebulization Given 06/20/17 0058)  ipratropium (ATROVENT) nebulizer solution 0.5 mg (0.5 mg Nebulization Given 06/20/17 0058)     Initial Impression / Assessment and Plan / ED Course  I have reviewed the triage vital signs and the nursing notes.  Pertinent labs & imaging results that were available during my care of the patient were reviewed by me and considered in my medical  decision making (see chart for details).     Patient with history of asthma presents with URI symptoms, fever today. She has a cough so has been using her nebulizers and inhaler but cough continues. Chest tightness is unilateral to the right. Cough is productive.  No wheezing on exam. No hypoxia. She is tachycardic on arrival with low grade temp. IVF's, nebulizer with Albuterol and Atrovent provided.   D-dimer added for persistent tachycardia to 130's. Albuterol considered as cause of high heart rate but as she was tachy on arrival with current symptoms and unilateral symptoms, d-dimer with as added. It is found to be negative.   Will discharge home on prednisone and cover with Z-pak. She is encouraged to follow up with PCP or return here for worsening symptoms.   Final Clinical Impressions(s) / ED Diagnoses   Final diagnoses:  None   1. URI New Prescriptions New Prescriptions   No medications on file     Elpidio Anis, PA-C 06/20/17 [id number]    Pricilla Loveless, MD 06/20/17 [phone]

## 2017-06-20 NOTE — Discharge Instructions (Signed)
Take prednisone as directed and continue nebulizer treatments and inhaler as necessary. Take the antibiotic until gone. Follow up with your doctor for recheck in 2-3 days. Return here if symptoms worsen.

## 2018-06-24 DIAGNOSIS — J4520 Mild intermittent asthma, uncomplicated: Secondary | ICD-10-CM | POA: Diagnosis not present

## 2018-06-24 DIAGNOSIS — Z131 Encounter for screening for diabetes mellitus: Secondary | ICD-10-CM | POA: Diagnosis not present

## 2018-06-24 DIAGNOSIS — G43909 Migraine, unspecified, not intractable, without status migrainosus: Secondary | ICD-10-CM | POA: Diagnosis not present

## 2018-06-24 DIAGNOSIS — I10 Essential (primary) hypertension: Secondary | ICD-10-CM | POA: Diagnosis not present

## 2018-06-24 DIAGNOSIS — Z13220 Encounter for screening for lipoid disorders: Secondary | ICD-10-CM | POA: Diagnosis not present

## 2018-10-19 DIAGNOSIS — G43909 Migraine, unspecified, not intractable, without status migrainosus: Secondary | ICD-10-CM | POA: Diagnosis not present

## 2018-10-19 DIAGNOSIS — J302 Other seasonal allergic rhinitis: Secondary | ICD-10-CM | POA: Diagnosis not present

## 2018-10-19 DIAGNOSIS — J4520 Mild intermittent asthma, uncomplicated: Secondary | ICD-10-CM | POA: Diagnosis not present

## 2018-10-19 DIAGNOSIS — I10 Essential (primary) hypertension: Secondary | ICD-10-CM | POA: Diagnosis not present

## 2018-11-16 IMAGING — CR DG CHEST 2V
2 series · 2 of 2 positions shown · non-contrast
Comparison: 10/01/2012

CLINICAL DATA: Pt states [REDACTED] she started having a sore throat
and today she started running a fever Pt states she is achy all over
Pt states she has asthma and has felt short of breath Pt has used an
inhaler and has used her nebulizer at home. Continues to feel short
of breath.

EXAM:
CHEST  2 VIEW

[w chest pa]
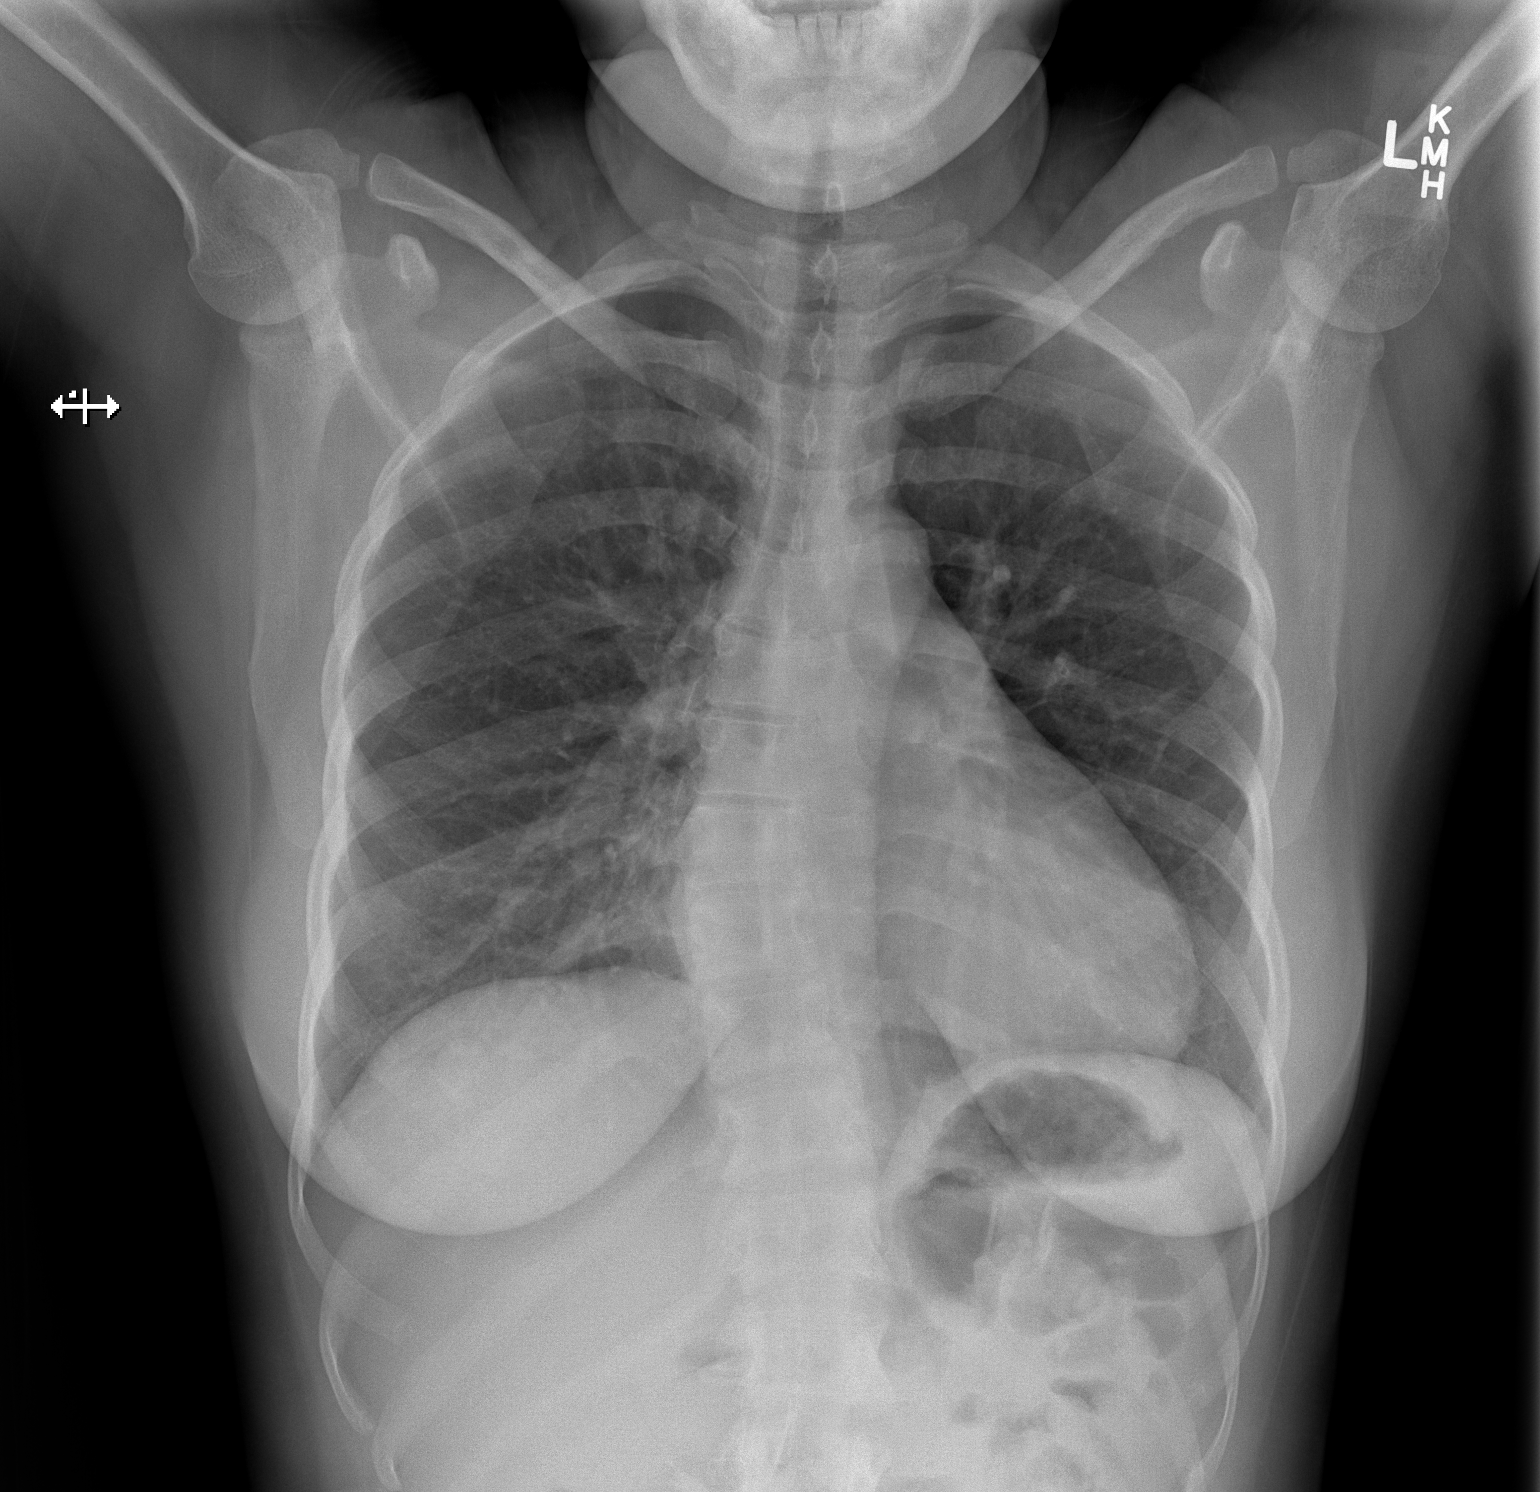

[w chest lat]
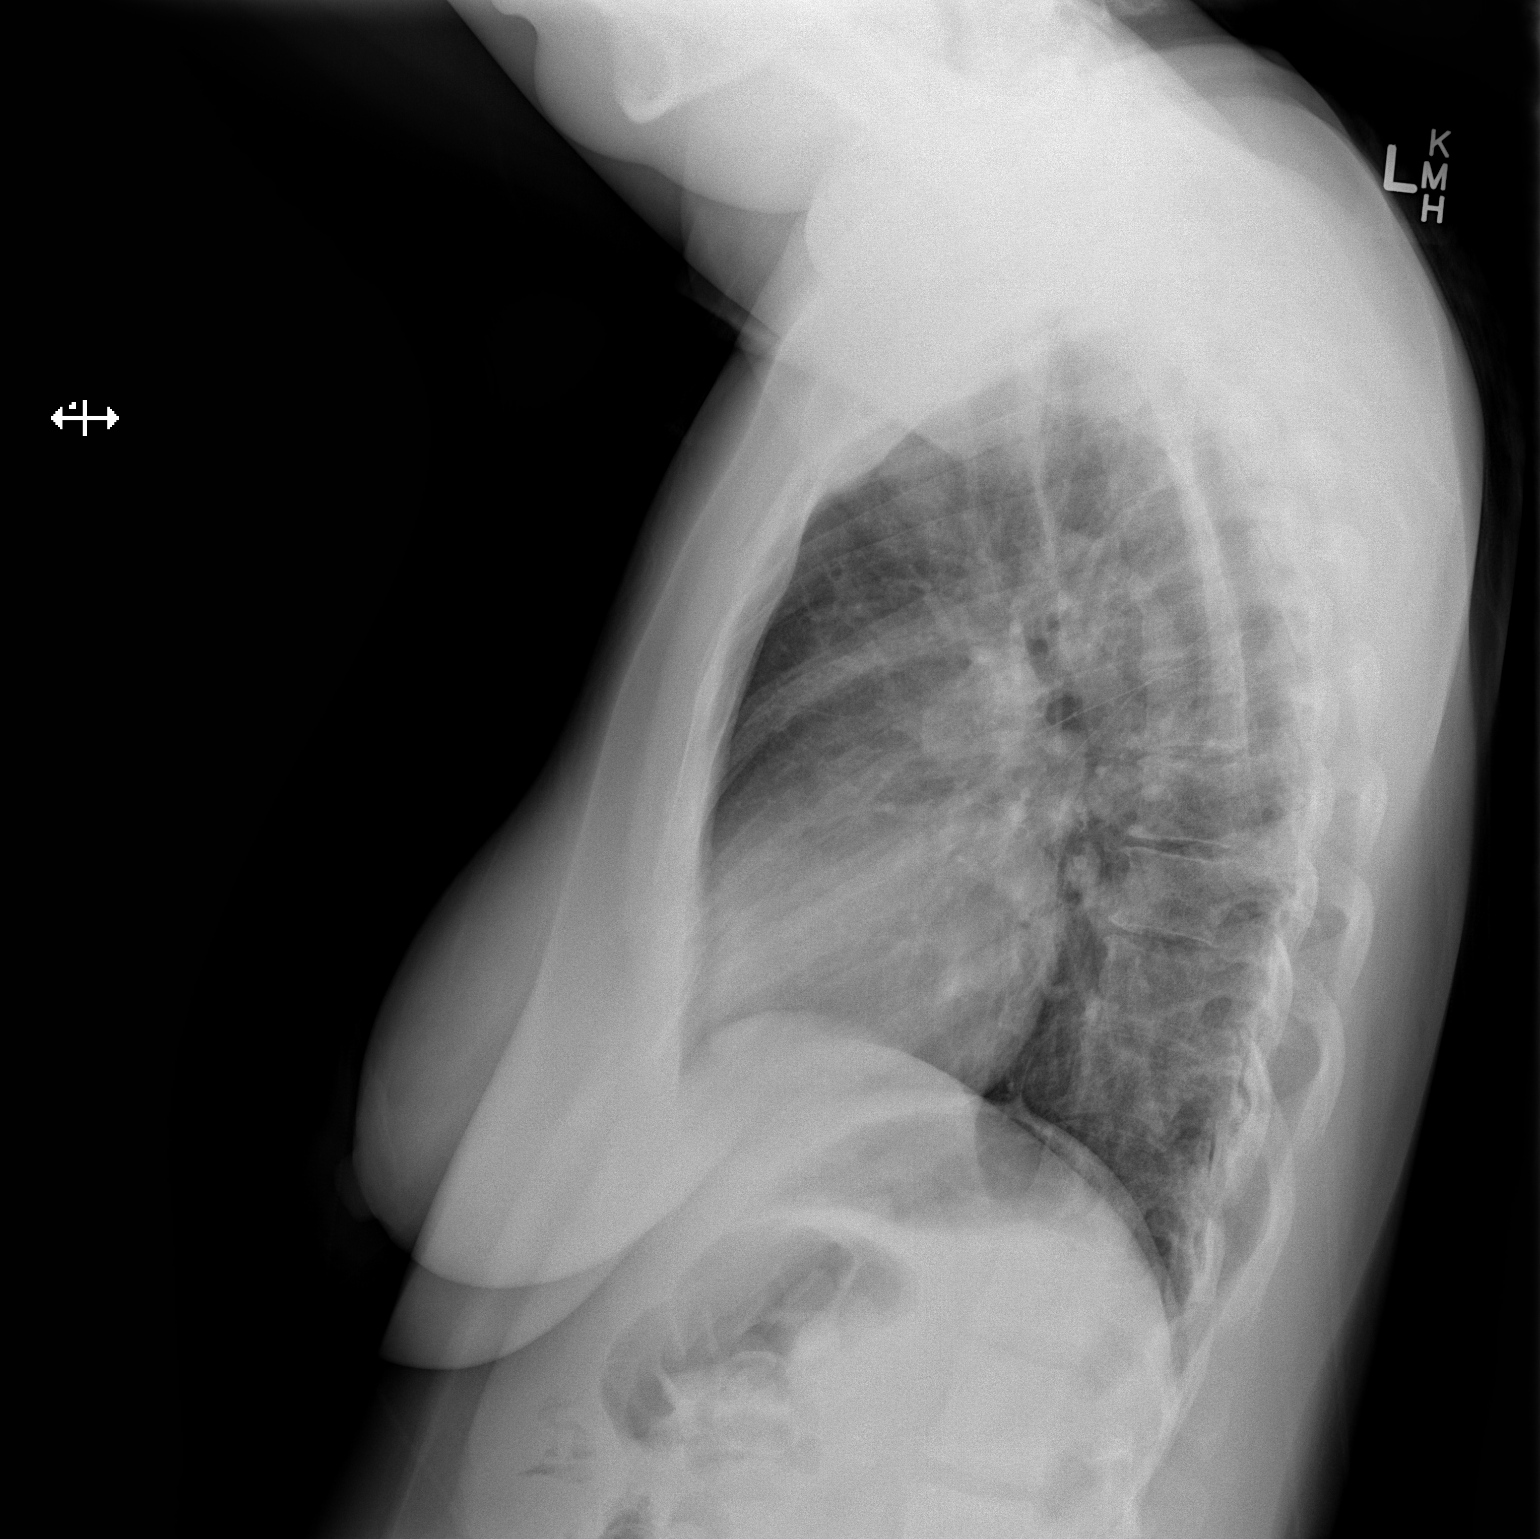

[2 of 2 positions shown; findings below may reference images not displayed]

FINDINGS: Heart size is upper normal. There are no focal consolidations or
pleural effusions. No pulmonary edema. Stable convex right scoliosis
of the thoracic spine.
IMPRESSION: No evidence for acute  abnormality.

## 2019-07-12 DIAGNOSIS — Z01419 Encounter for gynecological examination (general) (routine) without abnormal findings: Secondary | ICD-10-CM | POA: Diagnosis not present

## 2019-07-12 DIAGNOSIS — Z124 Encounter for screening for malignant neoplasm of cervix: Secondary | ICD-10-CM | POA: Diagnosis not present

## 2019-07-12 DIAGNOSIS — N926 Irregular menstruation, unspecified: Secondary | ICD-10-CM | POA: Diagnosis not present

## 2020-09-26 ENCOUNTER — Ambulatory Visit: Payer: Self-pay | Admitting: Neurology

## 2020-09-26 ENCOUNTER — Ambulatory Visit (INDEPENDENT_AMBULATORY_CARE_PROVIDER_SITE_OTHER): Payer: No Typology Code available for payment source | Admitting: Neurology

## 2020-09-26 ENCOUNTER — Other Ambulatory Visit: Payer: Self-pay

## 2020-09-26 ENCOUNTER — Encounter: Payer: Self-pay | Admitting: Neurology

## 2020-09-26 VITALS — BP 164/97 | HR 67 | Ht 63.0 in | Wt 140.0 lb

## 2020-09-26 DIAGNOSIS — G43119 Migraine with aura, intractable, without status migrainosus: Secondary | ICD-10-CM | POA: Diagnosis not present

## 2020-09-26 MED ORDER — ONDANSETRON HCL 4 MG PO TABS: 4.0000 mg | ORAL_TABLET | Freq: Three times a day (TID) | ORAL | 0 refills | Status: DC | PRN

## 2020-09-26 MED ORDER — UBRELVY 50 MG PO TABS: 50.0000 mg | ORAL_TABLET | ORAL | 3 refills | Status: DC | PRN

## 2020-09-26 MED ORDER — TOPIRAMATE 50 MG PO TABS: ORAL_TABLET | ORAL | 3 refills | Status: DC

## 2020-09-26 NOTE — Progress Notes (Signed)
Subjective:    Patient ID: Whitney Silva is a 40 y.o. female.  HPI     Huston Foley, MD, PhD [facility] [address] [address] [place], [place] [address]  Dear Dr. Normand Sloop,   I saw your patient, Whitney Silva, upon your kind request, in my Neurologic clinic today for initial consultation of her recurrent headaches, concern for migraines.  The patient is unaccompanied today.  As you know, Whitney Silva is a 40 year old right-handed woman with an underlying medical history of asthma, history of preeclampsia, and pregnancy-induced hypertension, who reports a history of migraines headaches for the past 7 years, since she had her second child.  She has a 37-year-old and a 39-year-old and prior to her second pregnancy and child she did not have any issues with blood pressure or with migraine headaches.  She does not have a telltale family history of migraines.  She does have a mostly left frontal throbbing headache, associated photophobia and nausea but typically no vomiting and headaches may last more than 24 hours.  In the past 2 to 3 months she has had an increase in her frequency to up to 2 or 3 migraines per week.  She often has a preceding visual aura as in a visual field black spot that moves.  She does get a yearly eye examination and has prescription eyeglasses for nearsightedness.  In between, she does not have any visual disturbance, no blurry vision, no double vision.  She denies any sudden onset of one-sided weakness or numbness or tingling or droopy face or slurring of speech.  She has taken Imitrex for the past several years intermittently.  She has taken it more frequently in the past 2 to 3 months and she has even taken 3 pills in 1 day, she is discouraged from doing so.  She is not currently on any preventative medication but recalls that her primary care physician tried her on Topamax in the past.  She believes it was helpful, she  certainly does not recall having had any serious side effects.  She would be willing to try it again.  Of note, she is on labetalol for blood pressure control.  It does not seem to help her migraines.  She is sleeps fairly well, she tries to get enough sleep.  She snores occasionally, not severely, denies any nocturnal or morning headaches or daytime somnolence.  Her Epworth sleepiness score is 2 out of 24, fatigue severity score is 13 out of 63.  She works in an Agricultural [employer].  She is a non-smoker and drinks alcohol occasionally, caffeine in the form of coffee, typically 1 cup/day.  Her asthma is generally well controlled, she had it as a child and then had more issues with it with her second pregnancy.  She only uses as needed albuterol.  She had blood work through her primary care physician on 07/20/2020 and I was able to review results on her cell phone as she was able to pull up her results on her cell phone on the portal.  BUN was 6, creatinine 0.58, lipid panel showed benign findings, CBC with benign as well.  Her Past Medical History Is Significant For: Past Medical History:  Diagnosis Date  . Asthma 2014   ALBUTEROL PRN  . Asthma    last attack Jan. 2014  . Headache(784.0) 2011   TOOK RX;  NONE RECENTLY  . Infection 09/2012   INFLUENZA; HOSPITALIZED  . Pregnancy induced hypertension 2012  .  Pregnancy induced hypertension     Her Past Surgical History Is Significant For: Past Surgical History:  Procedure Laterality Date  . OVARIAN CYST SURGERY    . RIGHT OOPHORECTOMY  2004   OVARIAN CYST    Her Family History Is Significant For: Family History  Problem Relation Age of Onset  . Early death Mother 51  . Kidney disease Mother   . Glaucoma Father   . Hypertension Father   . Asthma Brother   . Asthma Daughter   . Cancer Paternal Aunt   . Cancer Paternal Uncle        LUNG  . Asthma Brother     Her Social History Is Significant For: Social History   Socioeconomic  History  . Marital status: Married    Spouse name: DENNIS  . Number of children: 1  . Years of education: 1  . Highest education level: Not on file  Occupational History  . Occupation: HOMEMAKER  Tobacco Use  . Smoking status: Never Smoker  . Smokeless tobacco: Never Used  Substance and Sexual Activity  . Alcohol use: Yes    Comment: OCC  . Drug use: No  . Sexual activity: Yes    Partners: Male    Birth control/protection: None  Other Topics Concern  . Not on file  Social History Narrative   ** Merged History Encounter **       Social Determinants of Health   Financial Resource Strain: Not on file  Food Insecurity: Not on file  Transportation Needs: Not on file  Physical Activity: Not on file  Stress: Not on file  Social Connections: Not on file    Her Allergies Are:  Allergies  Allergen Reactions  . Nsaids Hives  . Asa [Aspirin] Hives and Rash  . Aspirin Hives and Rash  :   Her Current Medications Are:  Outpatient Encounter Medications as of 09/26/2020  Medication Sig  . budesonide-formoterol (SYMBICORT) 80-4.5 MCG/ACT inhaler Inhale 2 puffs into the lungs 2 (two) times daily.  Marland Kitchen labetalol (NORMODYNE) 100 MG tablet Take 200 mg by mouth 2 (two) times daily.  . SUMAtriptan (IMITREX) 100 MG tablet Take 100 mg by mouth every 2 (two) hours as needed for migraine. May repeat in 2 hours if headache persists or recurs.  . [DISCONTINUED] azithromycin (ZITHROMAX) 250 MG tablet Take 1 tablet (250 mg total) by mouth daily. Take first 2 tablets together, then 1 every day until finished.  . [DISCONTINUED] predniSONE (DELTASONE) 20 MG tablet Take 3 tablets (60 mg total) by mouth daily.   No facility-administered encounter medications on file as of 09/26/2020.  :   Review of Systems:  Out of a complete 14 point review of systems, all are reviewed and negative with the exception of these symptoms as listed below:  Review of Systems  Neurological:       Patient presents  today to discuss her migraines.  Patient has had a history of migraines since her last child was born about 7 years ago.  Patient does have light and sound, sensitivity with a migraine.  She does have nausea and vomiting with her migraines.  Patient gets migraines 2-3 times per week and they may last over 24 hours.  Imitrex does help relieve her migraines.  Patient does endorse snoring.  Patient has never had a sleep study.  Epworth Sleepiness Scale 0= would never doze 1= slight chance of dozing 2= moderate chance of dozing 3= high chance of dozing  Sitting and reading:  0 Watching TV: 0 Sitting inactive in a public place (ex. Theater or meeting): 0 As a passenger in a car for an hour without a break: 0 Lying down to rest in the afternoon: 2 Sitting and talking to someone: 0 Sitting quietly after lunch (no alcohol): 0 In a car, while stopped in traffic: 0 Total: 2     Objective:  Neurological Exam  Physical Exam Physical Examination:   Vitals:   09/26/20 1014  BP: (!) 164/97  Pulse: 67    General Examination: The patient is a very pleasant 40 y.o. female in no acute distress. She appears well-developed and well-nourished and well groomed.   HEENT: Normocephalic, atraumatic, pupils are equal, round and reactive to light and accommodation. Funduscopic exam is normal with sharp disc margins noted. Extraocular tracking is good without limitation to gaze excursion or nystagmus noted. Normal smooth pursuit is noted. Hearing is grossly intact. Tympanic membranes are clear bilaterally. Face is symmetric with normal facial animation and normal facial sensation. Speech is clear with no dysarthria noted. There is no hypophonia. There is no lip, neck/head, jaw or voice tremor. Neck is supple with full range of passive and active motion. There are no carotid bruits on auscultation. Oropharynx exam reveals: no signif. mouth dryness, good dental hygiene and mild airway crowding, due to smaller  airway entry. Tongue protrudes centrally and palate elevates symmetrically.   Chest: Clear to auscultation without wheezing, rhonchi or crackles noted.  Heart: S1+S2+0, regular and normal without murmurs, rubs or gallops noted.   Abdomen: Soft, non-tender and non-distended with normal bowel sounds appreciated on auscultation.  Extremities: There is no pitting edema in the distal lower extremities bilaterally. Pedal pulses are intact.  Skin: Warm and dry without trophic changes noted.  Musculoskeletal: exam reveals no obvious joint deformities, tenderness or joint swelling or erythema.   Neurologically:  Mental status: The patient is awake, alert and oriented in all 4 spheres. Her immediate and remote memory, attention, language skills and fund of knowledge are appropriate. There is no evidence of aphasia, agnosia, apraxia or anomia. Speech is clear with normal prosody and enunciation. Thought process is linear. Mood is normal and affect is normal.  Cranial nerves II - XII are as described above under HEENT exam. In addition: shoulder shrug is normal with equal shoulder height noted. Motor exam: Normal bulk, strength and tone is noted. There is no drift, tremor or rebound. Romberg is negative. Reflexes are 2+ throughout. Babinski: Toes are flexor bilaterally. Fine motor skills and coordination: intact with normal finger taps, normal hand movements, normal rapid alternating patting, normal foot taps and normal foot agility.  Cerebellar testing: No dysmetria or intention tremor on finger to nose testing. Heel to shin is unremarkable bilaterally. There is no truncal or gait ataxia.  Sensory exam: intact to light touch, vibration, temperature sense in the upper and lower extremities.  Gait, station and balance: She stands easily. No veering to one side is noted. No leaning to one side is noted. Posture is age-appropriate and stance is narrow based. Gait shows normal stride length and normal pace. No  problems turning are noted.  Tandem walk is unremarkable.         Assessment and Plan:   In summary, Whitney Silva is a very pleasant 40 y.o.-year old female with an underlying medical history of asthma, history of preeclampsia, and pregnancy-induced hypertension, who presents for evaluation of her recurrent migraines.  She has had migrainous headaches for the past approximately 7  years with recent exacerbation in her frequency up to 2 to 3/week, lasting at least 24 hours, associated with photophobia and nausea and often visual aura.  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  She had tried Topamax in the past, I think it is a good choice for her.  Of note, she is advised that Topamax can interfere with OCP.  She is currently not on any birth control, certainly no OCP or hormonal or nonhormonal birth control, they do use condoms.  She is also keeping a log of her cycle.  Neurological exam is nonfocal which is reassuring.  Nevertheless, since she has never had a brain scan, I would like to proceed with a brain MRI with and without contrast.  Of note, she had blood work through her primary care on 07/20/2020.  She was able to pull up her blood test results on her cell phone through the portal.  For abortive treatment she has been on Imitrex.  She can continue with it on an as-needed basis but is advised not to take more than 2 pills in 24 hours and I would also advise her not to take more than 3 pills a week.  She does have hypertension and may contribute to escalating blood pressure values.  She has been on Topamax for prevention in the past.  She would be willing to try it again.  I think this is a good choice for her.  We talked about expectations and possible side effects including weight loss, taste changes, paresthesias, and she was reminded that neither Topamax nor Imitrex are considered fully safe during pregnancy and Topamax can interfere  with oral contraceptive pills.  She is currently not on any hormonal contraception.  She is a regular checkup with her eye doctor once a year.  We will call her with her MRI results.  She was given a new prescription for Topamax with titration instructions.  We will start with 50 mg strength half a pill at bedtime.  For nausea, she is advised to try Zofran generic as needed.  She was given written instructions and a new prescription for this.  For additional abortive treatment I suggested Ubrelvy 50 mg strength, she was given a co-pay card and you prescription as well.  We can increase this to 100 mg strength if needed and ultimately, if need be, we can consider one of the newer injectable preventative medications for migraines including Aimovig, Ajovy or Emgality.  She is advised to follow-up routinely in 3 months to see one of our nurse practitioners.  Answered all her questions today and she was in agreement with the plan.  We also talked about common migraine triggers today.  Thank you very much for allowing me to participate in the care of this nice patient. If I can be of any further assistance to you please do not hesitate to call me at [phone].  Sincerely,   Huston Foley, MD, PhD

## 2020-09-26 NOTE — Patient Instructions (Signed)
It was nice to meet you today.  Please remember, common headache triggers are: sleep deprivation, dehydration, overheating, stress, hypoglycemia or skipping meals and blood sugar fluctuations, excessive pain medications or excessive alcohol use or caffeine withdrawal. Some people have food triggers such as aged cheese, orange juice or chocolate, especially dark chocolate, or MSG (monosodium glutamate). Try to avoid these headache triggers as much possible. It may be helpful to keep a headache diary to figure out what makes your headaches worse or brings them on and what alleviates them. Some people report headache onset after exercise but studies have shown that regular exercise may actually prevent headaches from coming. If you have exercise-induced headaches, please make sure that you drink plenty of fluid before and after exercising and that you do not over do it and do not overheat.  For migraine prevention, I would like to suggest: Topamax 50 mg: Take half a pill each bedtime for one week, then one pill at bedtime daily for one week, then 1-1/2 pills at bedtime daily for one week, then 2 pills at bedtime daily thereafter. Common side effects reported are: Sedation, sleepiness, tingling, unintended weight loss, change in taste especially with carbonated drinks, and rare side effects are glaucoma, kidney stones, and problems with thinking including word finding difficulties.  Please also be mindful that Topamax is not considered fully safe during pregnancy and may interfere with oral contraceptive medication.    We will do a brain MRI with and without contrast. We will call you to schedule after we have insurance approval. If you don't hear back from our office by next week, call us back.   For migraine abortive treatment, you can continue with the Imitrex as needed, do not take any more than 2 pills in 24 hours, I would also recommend no more than 3 pills a week.  For additional as needed use, try  Ubrelvy, 50 mg strength: Take 1 pill at onset of migraine headache, may repeat in 2 hours, no more than 4 pills in 24 hours, i.e. not to exceed 200 mg per 24 hours. May cause sedation and nausea.   We will provide a co-pay card for you.  I will also send a prescription to your pharmacy for Zofran for as needed use for nausea.  Zofran can also cause sleepiness and rarely involuntary movements.  Please follow-up in about 3 months to see one of our nurse practitioners in follow-up.

## 2020-09-28 ENCOUNTER — Telehealth: Payer: Self-pay | Admitting: Neurology

## 2020-09-28 NOTE — Telephone Encounter (Signed)
LVM for pt to call back about scheduling mri  [facility] auth: [id number] (exp. 09/28/20 to 11/12/20)

## 2020-09-28 NOTE — Telephone Encounter (Signed)
Patient returned my call she is scheduled at [facility] for 10/04/20.

## 2020-10-04 ENCOUNTER — Ambulatory Visit: Payer: No Typology Code available for payment source

## 2020-10-04 DIAGNOSIS — G43119 Migraine with aura, intractable, without status migrainosus: Secondary | ICD-10-CM

## 2020-10-04 MED ORDER — GADOBENATE DIMEGLUMINE 529 MG/ML IV SOLN
10.0000 mL | Freq: Once | INTRAVENOUS | Status: AC | PRN
  Administered 2020-10-04: 10 mL via INTRAVENOUS

## 2020-10-11 ENCOUNTER — Telehealth: Payer: Self-pay

## 2020-10-11 NOTE — Telephone Encounter (Signed)
Pt advised of results and verbalized understanding. She verbalized no questions/concerns at the time of the call.

## 2020-10-11 NOTE — Progress Notes (Signed)
Please call patient and advise her that her brain MRI was reported as normal with and without contrast.

## 2020-10-11 NOTE — Telephone Encounter (Signed)
-----   Message from Huston Foley, MD sent at 10/11/2020  7:56 AM EST ----- Please call patient and advise her that her brain MRI was reported as normal with and without contrast.

## 2020-11-22 ENCOUNTER — Other Ambulatory Visit: Payer: Self-pay | Admitting: Internal Medicine

## 2020-11-23 LAB — URINE CULTURE
MICRO NUMBER:: 11627058
SPECIMEN QUALITY:: ADEQUATE

## 2021-01-11 ENCOUNTER — Ambulatory Visit (INDEPENDENT_AMBULATORY_CARE_PROVIDER_SITE_OTHER): Payer: No Typology Code available for payment source | Admitting: Family Medicine

## 2021-01-11 ENCOUNTER — Encounter: Payer: Self-pay | Admitting: Family Medicine

## 2021-01-11 VITALS — BP 125/81 | HR 81 | Ht 65.0 in | Wt 127.0 lb

## 2021-01-11 DIAGNOSIS — G43109 Migraine with aura, not intractable, without status migrainosus: Secondary | ICD-10-CM | POA: Diagnosis not present

## 2021-01-11 MED ORDER — TOPIRAMATE 50 MG PO TABS: ORAL_TABLET | ORAL | 3 refills | Status: DC

## 2021-01-11 MED ORDER — UBRELVY 50 MG PO TABS: 50.0000 mg | ORAL_TABLET | ORAL | 11 refills | Status: DC | PRN

## 2021-01-11 NOTE — Patient Instructions (Addendum)
Below is our plan:  We will continue topiramate  daily. Please continue to monitor weight closely. If you are unable to maintain current weight, please let me know and we will consider switching to a different preventative. Continue Ubrelvy or sumatriptan for abortive therapy. Avoid regular use of abortive medications. Please do not become pregnant on topiramate.  Please make sure you are staying well hydrated. I recommend 50-60 ounces daily. Well balanced diet and regular exercise encouraged. Consistent sleep schedule with 6-8 hours recommended.   Please continue follow up with care team as directed.   Follow up in 1 year   You may receive a survey regarding today's visit. I encourage you to leave honest feed back as I do use this information to improve patient care. Thank you for seeing me today!    Topiramate tablets What is this medicine? TOPIRAMATE (toe PYRE a mate) is used to treat seizures in adults or children with epilepsy. It is also used for the prevention of migraine headaches. This medicine may be used for other purposes; ask your health care provider or pharmacist if you have questions. COMMON BRAND NAME(S): Topamax, Topiragen What should I tell my health care provider before I take this medicine? They need to know if you have any of these conditions:  bleeding disorder  kidney disease  lung disease  suicidal thoughts, plans, or attempt  an unusual or allergic reaction to topiramate, other medicines, foods, dyes, or preservatives  pregnant or trying to get pregnant  breast-feeding How should I use this medicine? Take this medicine by mouth with a glass of water. Follow the directions on the prescription label. Do not cut, crush or chew this medicine. Swallow the tablets whole. You can take it with or without food. If it upsets your stomach, take it with food. Take your medicine at regular intervals. Do not take it more often than directed. Do not stop taking  except on your doctor's advice. A special MedGuide will be given to you by the pharmacist with each prescription and refill. Be sure to read this information carefully each time. Talk to your pediatrician regarding the use of this medicine in children. While this drug may be prescribed for children as young as 24 years of age for selected conditions, precautions do apply. Overdosage: If you think you have taken too much of this medicine contact a poison control center or emergency room at once. NOTE: This medicine is only for you. Do not share this medicine with others. What if I miss a dose? If you miss a dose, take it as soon as you can. If your next dose is to be taken in less than 6 hours, then do not take the missed dose. Take the next dose at your regular time. Do not take double or extra doses. What may interact with this medicine? This medicine may interact with the following medications:  acetazolamide  alcohol  antihistamines for allergy, cough, and cold  aspirin and aspirin-like medicines  atropine  birth control pills  certain medicines for anxiety or sleep  certain medicines for bladder problems like oxybutynin, tolterodine  certain medicines for depression like amitriptyline, fluoxetine, sertraline  certain medicines for seizures like carbamazepine, phenobarbital, phenytoin, primidone, valproic acid, zonisamide  certain medicines for stomach problems like dicyclomine, hyoscyamine  certain medicines for travel sickness like scopolamine  certain medicines for Parkinson's disease like benztropine, trihexyphenidyl  certain medicines that treat or prevent blood clots like warfarin, enoxaparin, dalteparin, apixaban, dabigatran, and rivaroxaban  digoxin  general anesthetics like halothane, isoflurane, methoxyflurane, propofol  hydrochlorothiazide  ipratropium  lithium  medicines that relax muscles for surgery  metformin  narcotic medicines for pain  NSAIDs,  medicines for pain and inflammation, like ibuprofen or naproxen  phenothiazines like chlorpromazine, mesoridazine, prochlorperazine, thioridazine  pioglitazone This list may not describe all possible interactions. Give your health care provider a list of all the medicines, herbs, non-prescription drugs, or dietary supplements you use. Also tell them if you smoke, drink alcohol, or use illegal drugs. Some items may interact with your medicine. What should I watch for while using this medicine? Visit your doctor or health care professional for regular checks on your progress. Tell your health care professional if your symptoms do not start to get better or if they get worse. Do not stop taking except on your health care professional's advice. You may develop a severe reaction. Your health care professional will tell you how much medicine to take. Wear a medical ID bracelet or chain. Carry a card that describes your disease and details of your medicine and dosage times. This medicine can reduce the response of your body to heat or cold. Dress warm in cold weather and stay hydrated in hot weather. If possible, avoid extreme temperatures like saunas, hot tubs, very hot or cold showers, or activities that can cause dehydration such as vigorous exercise. Check with your health care professional if you have severe diarrhea, nausea, and vomiting, or if you sweat a lot. The loss of too much body fluid may make it dangerous for you to take this medicine. You may get drowsy or dizzy. Do not drive, use machinery, or do anything that needs mental alertness until you know how this medicine affects you. Do not stand up or sit up quickly, especially if you are an older patient. This reduces the risk of dizzy or fainting spells. Alcohol may interfere with the effect of this medicine. Avoid alcoholic drinks. Tell your health care professional right away if you have any change in your eyesight. Patients and their families  should watch out for new or worsening depression or thoughts of suicide. Also watch out for sudden changes in feelings such as feeling anxious, agitated, panicky, irritable, hostile, aggressive, impulsive, severely restless, overly excited and hyperactive, or not being able to sleep. If this happens, especially at the beginning of treatment or after a change in dose, call your healthcare professional. This medicine may cause serious skin reactions. They can happen weeks to months after starting the medicine. Contact your health care provider right away if you notice fevers or flu-like symptoms with a rash. The rash may be red or purple and then turn into blisters or peeling of the skin. Or, you might notice a red rash with swelling of the face, lips or lymph nodes in your neck or under your arms. Birth control may not work properly while you are taking this medicine. Talk to your health care professional about using an extra method of birth control. Women should inform their health care professional if they wish to become pregnant or think they might be pregnant. There is a potential for serious side effects and harm to an unborn child. Talk to your health care professional for more information. What side effects may I notice from receiving this medicine? Side effects that you should report to your doctor or health care professional as soon as possible:  allergic reactions like skin rash, itching or hives, swelling of the face, lips, or  tongue  blood in the urine  changes in vision  confusion  loss of memory  pain in lower back or side  pain when urinating  redness, blistering, peeling or loosening of the skin, including inside the mouth  signs and symptoms of bleeding such as bloody or black, tarry stools; red or dark brown urine; spitting up blood or brown material that looks like coffee grounds; red spots on the skin; unusual bruising or bleeding from the eyes, gums, or nose  signs and  symptoms of increased acid in the body like breathing fast; fast heartbeat; headache; confusion; unusually weak or tired; nausea, vomiting  suicidal thoughts, mood changes  trouble speaking or understanding  unusual sweating  unusually weak or tired Side effects that usually do not require medical attention (report to your doctor or health care professional if they continue or are bothersome):  dizziness  drowsiness  fever  loss of appetite  nausea, vomiting  pain, tingling, numbness in the hands or feet  stomach pain  tiredness  upset stomach This list may not describe all possible side effects. Call your doctor for medical advice about side effects. You may report side effects to FDA at 1-800-FDA-1088. Where should I keep my medicine? Keep out of the reach of children and pets. Store between 15 and 30 degrees C (59 and 86 degrees F). Protect from moisture. Keep the container tightly closed. Get rid of any unused medicine after the expiration date. To get rid of medicines that are no longer needed or have expired:  Take the medicine to a medicine take-back program. Check with your pharmacy or law enforcement to find a location.  If you cannot return the medicine, check the label or package insert to see if the medicine should be thrown out in the garbage or flushed down the toilet. If you are not sure, ask your health care provider. If it is safe to put it in the trash, empty the medicine out of the container. Mix the medicine with cat litter, dirt, coffee grounds, or other unwanted substance. Seal the mixture in a bag or container. Put it in the trash. NOTE: This sheet is a summary. It may not cover all possible information. If you have questions about this medicine, talk to your doctor, pharmacist, or health care provider.  2021 [employer]vier/[employer] Standard (2020-03-16 15:41:57)   Migraine Headache A migraine headache is a very strong throbbing pain on one side or both sides  of your head. This type of headache can also cause other symptoms. It can last from 4 hours to 3 days. Talk with your doctor about what things may bring on (trigger) this condition. What are the causes? The exact cause of this condition is not known. This condition may be triggered or caused by:  Drinking alcohol.  Smoking.  Taking medicines, such as: ? Medicine used to treat chest pain (nitroglycerin). ? Birth control pills. ? Estrogen. ? Some blood pressure medicines.  Eating or drinking certain products.  Doing physical activity. Other things that may trigger a migraine headache include:  Having a menstrual period.  Pregnancy.  Hunger.  Stress.  Not getting enough sleep or getting too much sleep.  Weather changes.  Tiredness (fatigue). What increases the risk?  Being 35-42 years old.  Being female.  Having a family history of migraine headaches.  Being Caucasian.  Having depression or anxiety.  Being very overweight. What are the signs or symptoms?  A throbbing pain. This pain may: ? Happen in  any area of the head, such as on one side or both sides. ? Make it hard to do daily activities. ? Get worse with physical activity. ? Get worse around bright lights or loud noises.  Other symptoms may include: ? Feeling sick to your stomach (nauseous). ? Vomiting. ? Dizziness. ? Being sensitive to bright lights, loud noises, or smells.  Before you get a migraine headache, you may get warning signs (an aura). An aura may include: ? Seeing flashing lights or having blind spots. ? Seeing bright spots, halos, or zigzag lines. ? Having tunnel vision or blurred vision. ? Having numbness or a tingling feeling. ? Having trouble talking. ? Having weak muscles.  Some people have symptoms after a migraine headache (postdromal phase), such as: ? Tiredness. ? Trouble thinking (concentrating). How is this treated?  Taking medicines that: ? Relieve pain. ? Relieve  the feeling of being sick to your stomach. ? Prevent migraine headaches.  Treatment may also include: ? Having acupuncture. ? Avoiding foods that bring on migraine headaches. ? Learning ways to control your body functions (biofeedback). ? Therapy to help you know and deal with negative thoughts (cognitive behavioral therapy). Follow these instructions at home: Medicines  Take over-the-counter and prescription medicines only as told by your doctor.  Ask your doctor if the medicine prescribed to you: ? Requires you to avoid driving or using heavy machinery. ? Can cause trouble pooping (constipation). You may need to take these steps to prevent or treat trouble pooping:  Drink enough fluid to keep your pee (urine) pale yellow.  Take over-the-counter or prescription medicines.  Eat foods that are high in fiber. These include beans, whole grains, and fresh fruits and vegetables.  Limit foods that are high in fat and sugar. These include fried or sweet foods. Lifestyle  Do not drink alcohol.  Do not use any products that contain nicotine or tobacco, such as cigarettes, e-cigarettes, and chewing tobacco. If you need help quitting, ask your doctor.  Get at least 8 hours of sleep every night.  Limit and deal with stress. General instructions  Keep a journal to find out what may bring on your migraine headaches. For example, write down: ? What you eat and drink. ? How much sleep you get. ? Any change in what you eat or drink. ? Any change in your medicines.  If you have a migraine headache: ? Avoid things that make your symptoms worse, such as bright lights. ? It may help to lie down in a dark, quiet room. ? Do not drive or use heavy machinery. ? Ask your doctor what activities are safe for you.  Keep all follow-up visits as told by your doctor. This is important.      Contact a doctor if:  You get a migraine headache that is different or worse than others you have  had.  You have more than 15 headache days in one month. Get help right away if:  Your migraine headache gets very bad.  Your migraine headache lasts longer than 72 hours.  You have a fever.  You have a stiff neck.  You have trouble seeing.  Your muscles feel weak or like you cannot control them.  You start to lose your balance a lot.  You start to have trouble walking.  You pass out (faint).  You have a seizure. Summary  A migraine headache is a very strong throbbing pain on one side or both sides of your head. These headaches  can also cause other symptoms.  This condition may be treated with medicines and changes to your lifestyle.  Keep a journal to find out what may bring on your migraine headaches.  Contact a doctor if you get a migraine headache that is different or worse than others you have had.  Contact your doctor if you have more than 15 headache days in a month. This information is not intended to replace advice given to you by your health care provider. Make sure you discuss any questions you have with your health care provider. Document Revised: 12/25/2018 Document Reviewed: 10/15/2018 Elsevier Patient Education  2021 [employer]

## 2021-01-11 NOTE — Progress Notes (Addendum)
Chief Complaint  Patient presents with  . Follow-up    Rm 2 alone Pt is well, medications help. Has about 5 migraines a month      HISTORY OF PRESENT ILLNESS: 01/11/21 ALL:  Whitney Silva is a 40 y.o. female here today for follow up for migraines. She was started on topiramate, Ubrelvy and ondasetron at last visit with Dr Frances FurbishAthar 09/2020. MRI was normal. She reports that migraines have improved. She was having daily headaches and now having about 5 per month. She feels Bernita RaisinUbrelvy works well for abortive therapy. She has used about 3 tablets, on average, per month. She does have mild tingling in her fingers and has lost about 13 pounds since starting topiramate. Weight loss was desired and she reports that blood pressures have been much better, recently. She will continue to monitor weight. She is aware to avoid pregnancy while taking topiramate.     HISTORY (copied from Dr Teofilo PodAthar's previous note)  Dear Dr. Normand Sloopillard,   I saw your patient, Whitney Silva, upon your kind request, in my Neurologic clinic today for initial consultation of her recurrent headaches, concern for migraines.  The patient is unaccompanied today.  As you know, Whitney Silva is a 40 year old right-handed woman with an underlying medical history of asthma, history of preeclampsia, and pregnancy-induced hypertension, who reports a history of migraines headaches for the past 7 years, since she had her second child.  She has a 39-year-old and a 39-year-old and prior to her second pregnancy and child she did not have any issues with blood pressure or with migraine headaches.  She does not have a telltale family history of migraines.  She does have a mostly left frontal throbbing headache, associated photophobia and nausea but typically no vomiting and headaches may last more than 24 hours.  In the past 2 to 3 months she has had an increase in her frequency to up to 2 or 3 migraines per week.  She often has a  preceding visual aura as in a visual field black spot that moves.  She does get a yearly eye examination and has prescription eyeglasses for nearsightedness.  In between, she does not have any visual disturbance, no blurry vision, no double vision.  She denies any sudden onset of one-sided weakness or numbness or tingling or droopy face or slurring of speech.  She has taken Imitrex for the past several years intermittently.  She has taken it more frequently in the past 2 to 3 months and she has even taken 3 pills in 1 day, she is discouraged from doing so.  She is not currently on any preventative medication but recalls that her primary care physician tried her on Topamax in the past.  She believes it was helpful, she certainly does not recall having had any serious side effects.  She would be willing to try it again.  Of note, she is on labetalol for blood pressure control.  It does not seem to help her migraines.  She is sleeps fairly well, she tries to get enough sleep.  She snores occasionally, not severely, denies any nocturnal or morning headaches or daytime somnolence.  Her Epworth sleepiness score is 2 out of 24, fatigue severity score is 13 out of 63.  She works in an Agricultural consultantadministrative position.  She is a non-smoker and drinks alcohol occasionally, caffeine in the form of coffee, typically 1 cup/day.  Her asthma is generally well controlled, she had it as a child and then had  more issues with it with her second pregnancy.  She only uses as needed albuterol.  She had blood work through her primary care physician on 07/20/2020 and I was able to review results on her cell phone as she was able to pull up her results on her cell phone on the portal.  BUN was 6, creatinine 0.58, lipid panel showed benign findings, CBC with benign as well.     REVIEW OF SYSTEMS: Out of a complete 14 system review of symptoms, the patient complains only of the following symptoms, headaches and all other reviewed systems are  negative.    ALLERGIES: Allergies  Allergen Reactions  . Nsaids Hives  . Asa [Aspirin] Hives and Rash  . Aspirin Hives and Rash     HOME MEDICATIONS: Outpatient Medications Prior to Visit  Medication Sig Dispense Refill  . budesonide-formoterol (SYMBICORT) 80-4.5 MCG/ACT inhaler Inhale 2 puffs into the lungs 2 (two) times daily.    . ondansetron (ZOFRAN) 4 MG tablet Take 1 tablet (4 mg total) by mouth every 8 (eight) hours as needed for nausea or vomiting. 20 tablet 0  . SUMAtriptan (IMITREX) 100 MG tablet Take 100 mg by mouth every 2 (two) hours as needed for migraine. May repeat in 2 hours if headache persists or recurs.    . topiramate (TOPAMAX) 50 MG tablet 1/2 pill each bedtime x 1 week, then 1 pill nightly x 1 week, then 1-1/2 pills nightly x 1 week, then 2 pills nightly thereafter. 60 tablet 3  . Ubrogepant (UBRELVY) 50 MG TABS Take 50 mg by mouth as needed (may repeat once in 2 hours. no more than 2 pills in 24 h.). 10 tablet 3  . labetalol (NORMODYNE) 100 MG tablet Take 200 mg by mouth 2 (two) times daily.     No facility-administered medications prior to visit.     PAST MEDICAL HISTORY: Past Medical History:  Diagnosis Date  . Asthma 2014   ALBUTEROL PRN  . Asthma    last attack Jan. 2014  . Headache(784.0) 2011   TOOK RX;  NONE RECENTLY  . Infection 09/2012   INFLUENZA; HOSPITALIZED  . Pregnancy induced hypertension 2012  . Pregnancy induced hypertension      PAST SURGICAL HISTORY: Past Surgical History:  Procedure Laterality Date  . OVARIAN CYST SURGERY    . RIGHT OOPHORECTOMY  2004   OVARIAN CYST     FAMILY HISTORY: Family History  Problem Relation Age of Onset  . Early death Mother 73  . Kidney disease Mother   . Glaucoma Father   . Hypertension Father   . Asthma Brother   . Asthma Daughter   . Cancer Paternal Aunt   . Cancer Paternal Uncle        LUNG  . Asthma Brother      SOCIAL HISTORY: Social History   Socioeconomic History  .  Marital status: Married    Spouse name: DENNIS  . Number of children: 1  . Years of education: 24  . Highest education level: Not on file  Occupational History  . Occupation: HOMEMAKER  Tobacco Use  . Smoking status: Never Smoker  . Smokeless tobacco: Never Used  Substance and Sexual Activity  . Alcohol use: Yes    Comment: OCC  . Drug use: No  . Sexual activity: Yes    Partners: Male    Birth control/protection: None  Other Topics Concern  . Not on file  Social History Narrative   ** Merged History Encounter **  Social Determinants of Health   Financial Resource Strain: Not on file  Food Insecurity: Not on file  Transportation Needs: Not on file  Physical Activity: Not on file  Stress: Not on file  Social Connections: Not on file  Intimate Partner Violence: Not on file      PHYSICAL EXAM  Vitals:   01/11/21 0802  BP: 125/81  Pulse: 81  Weight: 127 lb (57.6 kg)  Height: 5\' 5"  (1.651 m)   Body mass index is 21.13 kg/m.   Generalized: Well developed, in no acute distress  Cardiology: normal rate and rhythm, no murmur auscultated  Respiratory: clear to auscultation bilaterally    Neurological examination  Mentation: Alert oriented to time, place, history taking. Follows all commands speech and language fluent Cranial nerve II-XII: Pupils were equal round reactive to light. Extraocular movements were full, visual field were full on confrontational test. Facial sensation and strength were normal. Head turning and shoulder shrug  were normal and symmetric. Motor: The motor testing reveals 5 over 5 strength of all 4 extremities. Good symmetric motor tone is noted throughout.  Gait and station: Gait is normal.      DIAGNOSTIC DATA (LABS, IMAGING, TESTING) - I reviewed patient records, labs, notes, testing and imaging myself where available.  Lab Results  Component Value Date   WBC 15.2 (H) 06/19/2017   HGB 11.0 (L) 06/19/2017   HCT 33.1 (L) 06/19/2017    MCV 76.6 (L) 06/19/2017   PLT 362 06/19/2017      Component Value Date/Time   NA 136 06/19/2017 2129   K 3.2 (L) 06/19/2017 2129   CL 105 06/19/2017 2129   CO2 21 (L) 06/19/2017 2129   GLUCOSE 99 06/19/2017 2129   BUN 5 (L) 06/19/2017 2129   CREATININE 0.61 06/19/2017 2129   CALCIUM 9.5 06/19/2017 2129   PROT 8.3 (H) 06/19/2017 2129   ALBUMIN 4.2 06/19/2017 2129   AST 40 06/19/2017 2129   ALT 83 (H) 06/19/2017 2129   ALKPHOS 90 06/19/2017 2129   BILITOT 0.7 06/19/2017 2129   GFRNONAA >60 06/19/2017 2129   GFRAA >60 06/19/2017 2129   No results found for: CHOL, HDL, LDLCALC, LDLDIRECT, TRIG, CHOLHDL No results found for: 2130 No results found for: VITAMINB12 No results found for: TSH  No flowsheet data found.   No flowsheet data found.   ASSESSMENT AND PLAN  40 y.o. year old female  has a past medical history of Asthma (2014), Asthma, Headache(784.0) (2011), Infection (09/2012), Pregnancy induced hypertension (2012), and Pregnancy induced hypertension. here with     Migraine with aura and without status migrainosus, not intractable    No orders of the defined types were placed in this encounter.    Meds ordered this encounter  Medications  . topiramate (TOPAMAX) 50 MG tablet    Sig: 1/2 pill each bedtime x 1 week, then 1 pill nightly x 1 week, then 1-1/2 pills nightly x 1 week, then 2 pills nightly thereafter.    Dispense:  180 tablet    Refill:  3    Order Specific Question:   Supervising Provider    Answer:   08-27-2002 Anson Fret  . Ubrogepant (UBRELVY) 50 MG TABS    Sig: Take 50 mg by mouth as needed (may repeat once in 2 hours. no more than 2 pills in 24 h.).    Dispense:  10 tablet    Refill:  11    Order Specific Question:   Supervising Provider  AnswerAnson Fret [[id number]]      I spent 20 minutes of face-to-face and non-face-to-face time with patient.  This included previsit chart review, lab review, study review, order  entry, electronic health record documentation, patient education.    Shawnie Dapper, MSN, FNP-C 01/11/2021, 8:27 AM  [facility] [address], [address] [place], [place] [address] [phone]  I reviewed the above note and documentation by the Nurse Practitioner and agree with the history, exam, assessment and plan as outlined above. I was available for consultation. Huston Foley, MD, PhD [facility])

## 2021-04-05 ENCOUNTER — Other Ambulatory Visit: Payer: Self-pay | Admitting: Neurology

## 2021-05-03 ENCOUNTER — Other Ambulatory Visit: Payer: Self-pay | Admitting: Neurology

## 2021-09-05 ENCOUNTER — Other Ambulatory Visit: Payer: Self-pay | Admitting: Neurology

## 2022-01-04 ENCOUNTER — Other Ambulatory Visit: Payer: Self-pay | Admitting: Internal Medicine

## 2022-01-04 LAB — CBC
HCT: 37.5 % (ref 35.0–45.0)
Hemoglobin: 11.7 g/dL (ref 11.7–15.5)
MCH: 27.5 pg (ref 27.0–33.0)
MCHC: 31.2 g/dL — ABNORMAL LOW (ref 32.0–36.0)
MCV: 88.2 fL (ref 80.0–100.0)
MPV: 11.6 fL (ref 7.5–12.5)
Platelets: 347 10*3/uL (ref 140–400)
RBC: 4.25 10*6/uL (ref 3.80–5.10)
RDW: 11.8 % (ref 11.0–15.0)
WBC: 5.4 10*3/uL (ref 3.8–10.8)

## 2022-01-04 LAB — EXTRA SPECIMEN

## 2022-07-10 ENCOUNTER — Other Ambulatory Visit: Payer: Self-pay | Admitting: Internal Medicine

## 2022-07-12 LAB — CBC
HCT: 33.8 % — ABNORMAL LOW (ref 35.0–45.0)
Hemoglobin: 9.9 g/dL — ABNORMAL LOW (ref 11.7–15.5)
MCH: 22.3 pg — ABNORMAL LOW (ref 27.0–33.0)
MCHC: 29.3 g/dL — ABNORMAL LOW (ref 32.0–36.0)
MCV: 76.1 fL — ABNORMAL LOW (ref 80.0–100.0)
MPV: 11.2 fL (ref 7.5–12.5)
Platelets: 468 10*3/uL — ABNORMAL HIGH (ref 140–400)
RBC: 4.44 10*6/uL (ref 3.80–5.10)
RDW: 14 % (ref 11.0–15.0)
WBC: 4.8 10*3/uL (ref 3.8–10.8)

## 2022-07-12 LAB — VITAMIN D 25 HYDROXY (VIT D DEFICIENCY, FRACTURES)

## 2022-07-12 LAB — LIPID PANEL

## 2022-07-12 LAB — COMPLETE METABOLIC PANEL WITH GFR

## 2022-07-12 LAB — TSH

## 2022-07-13 LAB — COMPLETE METABOLIC PANEL WITH GFR
AG Ratio: 1.2 (calc) (ref 1.0–2.5)
ALT: 35 U/L — ABNORMAL HIGH (ref 6–29)
AST: 25 U/L (ref 10–30)
Albumin: 4.4 g/dL (ref 3.6–5.1)
Alkaline phosphatase (APISO): 102 U/L (ref 31–125)
BUN: 9 mg/dL (ref 7–25)
CO2: 21 mmol/L (ref 20–32)
Calcium: 9.6 mg/dL (ref 8.6–10.2)
Chloride: 109 mmol/L (ref 98–110)
Creat: 0.73 mg/dL (ref 0.50–0.99)
Globulin: 3.6 g/dL (calc) (ref 1.9–3.7)
Glucose, Bld: 72 mg/dL (ref 65–99)
Potassium: 4 mmol/L (ref 3.5–5.3)
Sodium: 138 mmol/L (ref 135–146)
Total Bilirubin: 0.3 mg/dL (ref 0.2–1.2)
Total Protein: 8 g/dL (ref 6.1–8.1)
eGFR: 107 mL/min/{1.73_m2} (ref >=60)

## 2022-07-13 LAB — LIPID PANEL
Cholesterol: 194 mg/dL (ref <200)
HDL: 81 mg/dL (ref >=50)
LDL Cholesterol (Calc): 99 mg/dL (calc)
Non-HDL Cholesterol (Calc): 113 mg/dL (calc) (ref <130)
Total CHOL/HDL Ratio: 2.4 (calc) (ref <5.0)
Triglycerides: 57 mg/dL (ref <150)

## 2022-07-13 LAB — TSH: TSH: 1.06 mIU/L

## 2022-07-13 LAB — VITAMIN D 25 HYDROXY (VIT D DEFICIENCY, FRACTURES): Vit D, 25-Hydroxy: 24 ng/mL — ABNORMAL LOW (ref 30–100)

## 2022-10-15 ENCOUNTER — Encounter (HOSPITAL_BASED_OUTPATIENT_CLINIC_OR_DEPARTMENT_OTHER): Payer: Self-pay | Admitting: Obstetrics and Gynecology

## 2022-10-15 NOTE — Progress Notes (Signed)
Spoke w/ via phone for pre-op interview--- [employer]----   UPT, per anesthesia. Surgeon orders pending.            Lab results------ COVID test -----patient states asymptomatic no test needed Arrive at -------1330 NPO after MN NO Solid Food.  Clear liquids from MN until---1230 Med rec completed Medications to take morning of surgery -----Symbacort Diabetic medication ----- Patient instructed no nail polish to be worn day of surgery Patient instructed to bring photo id and insurance card day of surgery Patient aware to have Driver (ride ) / caregiver  Husband Whitney Silva  for 24 hours after surgery  Patient Special Instructions ----- Pre-Op special Istructions ----- Patient verbalized understanding of instructions that were given at this phone interview. Patient denies shortness of breath, chest pain, fever, cough at this phone interview.

## 2022-10-18 ENCOUNTER — Other Ambulatory Visit: Payer: Self-pay | Admitting: Obstetrics and Gynecology

## 2022-10-18 NOTE — Progress Notes (Signed)
Notified pt of arrival time change. To arrive 1145, clear liquids until 1045, no solids after midnight. Reviewed location. Pt verbalizes understanding and states no further questions.

## 2022-10-21 ENCOUNTER — Encounter (HOSPITAL_BASED_OUTPATIENT_CLINIC_OR_DEPARTMENT_OTHER)
Admission: RE | Disposition: A | Discharge status: Home / Self Care | Payer: Self-pay | Source: Ambulatory Visit | Attending: Obstetrics and Gynecology

## 2022-10-21 ENCOUNTER — Encounter (HOSPITAL_BASED_OUTPATIENT_CLINIC_OR_DEPARTMENT_OTHER): Payer: Self-pay | Admitting: Obstetrics and Gynecology

## 2022-10-21 ENCOUNTER — Ambulatory Visit (HOSPITAL_BASED_OUTPATIENT_CLINIC_OR_DEPARTMENT_OTHER): Payer: 59 | Admitting: Anesthesiology

## 2022-10-21 ENCOUNTER — Ambulatory Visit (HOSPITAL_BASED_OUTPATIENT_CLINIC_OR_DEPARTMENT_OTHER)
Admission: RE | Admit: 2022-10-21 | Discharge: 2022-10-21 | Disposition: A | Discharge status: Home / Self Care | Payer: 59 | Source: Ambulatory Visit | Attending: Obstetrics and Gynecology | Admitting: Obstetrics and Gynecology

## 2022-10-21 ENCOUNTER — Other Ambulatory Visit: Payer: Self-pay

## 2022-10-21 DIAGNOSIS — N840 Polyp of corpus uteri: Secondary | ICD-10-CM | POA: Diagnosis not present

## 2022-10-21 DIAGNOSIS — N921 Excessive and frequent menstruation with irregular cycle: Secondary | ICD-10-CM

## 2022-10-21 DIAGNOSIS — N858 Other specified noninflammatory disorders of uterus: Secondary | ICD-10-CM

## 2022-10-21 DIAGNOSIS — Z01818 Encounter for other preprocedural examination: Secondary | ICD-10-CM

## 2022-10-21 HISTORY — PX: DILATATION & CURETTAGE/HYSTEROSCOPY WITH MYOSURE: SHX6511

## 2022-10-21 HISTORY — PX: INTRAUTERINE DEVICE (IUD) INSERTION: SHX5877

## 2022-10-21 LAB — CBC
HCT: 31.8 % — ABNORMAL LOW (ref 36.0–46.0)
Hemoglobin: 9.1 g/dL — ABNORMAL LOW (ref 12.0–15.0)
MCH: 20.2 pg — ABNORMAL LOW (ref 26.0–34.0)
MCHC: 28.6 g/dL — ABNORMAL LOW (ref 30.0–36.0)
MCV: 70.5 fL — ABNORMAL LOW (ref 80.0–100.0)
Platelets: 529 10*3/uL — ABNORMAL HIGH (ref 150–400)
RBC: 4.51 MIL/uL (ref 3.87–5.11)
RDW: 18.4 % — ABNORMAL HIGH (ref 11.5–15.5)
WBC: 4.8 10*3/uL (ref 4.0–10.5)
nRBC: 0 % (ref 0.0–0.2)

## 2022-10-21 LAB — POCT PREGNANCY, URINE
Preg Test, Ur: NEGATIVE
Preg Test, Ur: POSITIVE — AB

## 2022-10-21 SURGERY — INSERTION, INTRAUTERINE DEVICE
Anesthesia: General | Site: Uterus

## 2022-10-21 MED ORDER — ROCURONIUM BROMIDE 10 MG/ML (PF) SYRINGE
PREFILLED_SYRINGE | INTRAVENOUS | Status: AC
  Filled 2022-10-21: qty 10

## 2022-10-21 MED ORDER — LIDOCAINE HCL (PF) 2 % IJ SOLN
INTRAMUSCULAR | Status: DC | PRN
  Administered 2022-10-21: 10 mL

## 2022-10-21 MED ORDER — LIDOCAINE 2% (20 MG/ML) 5 ML SYRINGE
INTRAMUSCULAR | Status: DC | PRN
  Administered 2022-10-21: 40 mg via INTRAVENOUS

## 2022-10-21 MED ORDER — OXYCODONE-ACETAMINOPHEN 5-325 MG PO TABS: 1.0000 | ORAL_TABLET | Freq: Four times a day (QID) | ORAL | 0 refills | Status: AC | PRN

## 2022-10-21 MED ORDER — MIDAZOLAM HCL 2 MG/2ML IJ SOLN
INTRAMUSCULAR | Status: DC | PRN
  Administered 2022-10-21: 2 mg via INTRAVENOUS

## 2022-10-21 MED ORDER — PARAGARD INTRAUTERINE COPPER IU IUD
1.0000 | INTRAUTERINE_SYSTEM | INTRAUTERINE | Status: AC
  Administered 2022-10-21: 1 via INTRAUTERINE
  Filled 2022-10-21: qty 1

## 2022-10-21 MED ORDER — DEXAMETHASONE SODIUM PHOSPHATE 10 MG/ML IJ SOLN
INTRAMUSCULAR | Status: DC | PRN
  Administered 2022-10-21: 10 mg via INTRAVENOUS

## 2022-10-21 MED ORDER — KETOROLAC TROMETHAMINE 30 MG/ML IJ SOLN
INTRAMUSCULAR | Status: AC
  Filled 2022-10-21: qty 1

## 2022-10-21 MED ORDER — FENTANYL CITRATE (PF) 250 MCG/5ML IJ SOLN
INTRAMUSCULAR | Status: DC | PRN
  Administered 2022-10-21: 50 ug via INTRAVENOUS

## 2022-10-21 MED ORDER — FENTANYL CITRATE (PF) 100 MCG/2ML IJ SOLN
25.0000 ug | INTRAMUSCULAR | Status: DC | PRN
  Administered 2022-10-21: 25 ug via INTRAVENOUS

## 2022-10-21 MED ORDER — ONDANSETRON HCL 4 MG/2ML IJ SOLN
INTRAMUSCULAR | Status: AC
  Filled 2022-10-21: qty 8

## 2022-10-21 MED ORDER — LACTATED RINGERS IV SOLN: INTRAVENOUS | Status: DC

## 2022-10-21 MED ORDER — MIDAZOLAM HCL 2 MG/2ML IJ SOLN
INTRAMUSCULAR | Status: AC
  Filled 2022-10-21: qty 2

## 2022-10-21 MED ORDER — PROPOFOL 10 MG/ML IV BOLUS
INTRAVENOUS | Status: DC | PRN
  Administered 2022-10-21: 150 mg via INTRAVENOUS

## 2022-10-21 MED ORDER — ACETAMINOPHEN 500 MG PO TABS
ORAL_TABLET | ORAL | Status: AC
  Filled 2022-10-21: qty 2

## 2022-10-21 MED ORDER — PHENYLEPHRINE 80 MCG/ML (10ML) SYRINGE FOR IV PUSH (FOR BLOOD PRESSURE SUPPORT)
PREFILLED_SYRINGE | INTRAVENOUS | Status: AC
  Filled 2022-10-21: qty 30

## 2022-10-21 MED ORDER — LIDOCAINE HCL (PF) 2 % IJ SOLN
INTRAMUSCULAR | Status: AC
  Filled 2022-10-21: qty 20

## 2022-10-21 MED ORDER — EPHEDRINE 5 MG/ML INJ
INTRAVENOUS | Status: AC
  Filled 2022-10-21: qty 5

## 2022-10-21 MED ORDER — FENTANYL CITRATE (PF) 100 MCG/2ML IJ SOLN
INTRAMUSCULAR | Status: AC
  Filled 2022-10-21: qty 2

## 2022-10-21 MED ORDER — SODIUM CHLORIDE 0.9 % IR SOLN
Status: DC | PRN
  Administered 2022-10-21 (×2): 3000 mL

## 2022-10-21 MED ORDER — AMISULPRIDE (ANTIEMETIC) 5 MG/2ML IV SOLN: 10.0000 mg | Freq: Once | INTRAVENOUS | Status: DC | PRN

## 2022-10-21 MED ORDER — OXYCODONE HCL 5 MG/5ML PO SOLN: 5.0000 mg | Freq: Once | ORAL | Status: DC | PRN

## 2022-10-21 MED ORDER — DEXAMETHASONE SODIUM PHOSPHATE 10 MG/ML IJ SOLN
INTRAMUSCULAR | Status: AC
  Filled 2022-10-21: qty 4

## 2022-10-21 MED ORDER — OXYCODONE HCL 5 MG PO TABS: 5.0000 mg | ORAL_TABLET | Freq: Once | ORAL | Status: DC | PRN

## 2022-10-21 MED ORDER — ACETAMINOPHEN 500 MG PO TABS
1000.0000 mg | ORAL_TABLET | Freq: Once | ORAL | Status: AC
  Administered 2022-10-21: 1000 mg via ORAL

## 2022-10-21 MED ORDER — ONDANSETRON HCL 4 MG/2ML IJ SOLN
INTRAMUSCULAR | Status: DC | PRN
  Administered 2022-10-21: 4 mg via INTRAVENOUS

## 2022-10-21 SURGICAL SUPPLY — 16 items
DEVICE MYOSURE LITE (MISCELLANEOUS) IMPLANT
DEVICE MYOSURE REACH (MISCELLANEOUS) ×1 IMPLANT
DILATOR CANAL MILEX (MISCELLANEOUS) IMPLANT
DRSG TELFA 3X8 NADH STRL (GAUZE/BANDAGES/DRESSINGS) ×2 IMPLANT
GLOVE BIO SURGEON STRL SZ 6.5 (GLOVE) ×6 IMPLANT
GLOVE BIOGEL PI IND STRL 7.0 (GLOVE) ×9 IMPLANT
GOWN STRL REUS W/TWL LRG LVL3 (GOWN DISPOSABLE) ×5 IMPLANT
IV NS IRRIG 3000ML ARTHROMATIC (IV SOLUTION) ×2 IMPLANT
KIT PROCEDURE FLUENT (KITS) ×3 IMPLANT
KIT TURNOVER CYSTO (KITS) ×3 IMPLANT
PACK VAGINAL MINOR WOMEN LF (CUSTOM PROCEDURE TRAY) ×3 IMPLANT
PAD OB MATERNITY 4.3X12.25 (PERSONAL CARE ITEMS) ×3 IMPLANT
SEAL ROD LENS SCOPE MYOSURE (ABLATOR) ×3 IMPLANT
TOWEL OR 17X26 10 PK STRL BLUE (TOWEL DISPOSABLE) ×6 IMPLANT
UNDERPAD 30X36 HEAVY ABSORB (UNDERPADS AND DIAPERS) ×3 IMPLANT
paragaurd Intrauterine copper contraceptive ×1 IMPLANT

## 2022-10-21 NOTE — Anesthesia Postprocedure Evaluation (Signed)
Anesthesia Post Note  Patient: Whitney Silva  Procedure(s) Performed: INTRAUTERINE DEVICE (IUD) INSERTION (Uterus) DILATATION & CURETTAGE/HYSTEROSCOPY WITH MYOSURE; REMOVAL OF FOUR MASSES (Uterus)     Patient location during evaluation: PACU Anesthesia Type: General Level of consciousness: awake and alert Pain management: pain level controlled Vital Signs Assessment: post-procedure vital signs reviewed and stable Respiratory status: spontaneous breathing, nonlabored ventilation, respiratory function stable and patient connected to nasal cannula oxygen Cardiovascular status: blood pressure returned to baseline and stable Postop Assessment: no apparent nausea or vomiting Anesthetic complications: no  No notable events documented.  Last Vitals:  Vitals:   10/21/22 1645 10/21/22 1704  BP: (!) 134/92 133/82  Pulse:    Resp: 16 17  Temp: 36.6 C   SpO2: 98% 100%    Last Pain:  Vitals:   10/21/22 1645  TempSrc:   PainSc: 5                  Izaia Say L Calen Posch

## 2022-10-21 NOTE — Anesthesia Procedure Notes (Signed)
Procedure Name: LMA Insertion Date/Time: 10/21/2022 3:10 PM  Performed by: Clearnce Sorrel, CRNAPre-anesthesia Checklist: Patient identified, Emergency Drugs available, Suction available and Patient being monitored Patient Re-evaluated:Patient Re-evaluated prior to induction Oxygen Delivery Method: Circle System Utilized Preoxygenation: Pre-oxygenation with 100% oxygen Induction Type: IV induction Ventilation: Mask ventilation without difficulty LMA: LMA inserted LMA Size: 4.0 Number of attempts: 1 Airway Equipment and Method: Bite block Placement Confirmation: positive ETCO2 Tube secured with: Tape Dental Injury: Teeth and Oropharynx as per pre-operative assessment

## 2022-10-21 NOTE — Transfer of Care (Signed)
Immediate Anesthesia Transfer of Care Note  Patient: Anessia Oakland  Procedure(s) Performed: INTRAUTERINE DEVICE (IUD) INSERTION (Uterus) DILATATION & CURETTAGE/HYSTEROSCOPY WITH MYOSURE; REMOVAL OF FOUR MASSES (Uterus)  Patient Location: PACU  Anesthesia Type:General  Level of Consciousness: awake, alert , and oriented  Airway & Oxygen Therapy: Patient Spontanous Breathing  Post-op Assessment: Report given to RN and Post -op Vital signs reviewed and stable  Post vital signs: Reviewed and stable  Last Vitals:  Vitals Value Taken Time  BP 136/94 10/21/22 1600  Temp 36.4 C 10/21/22 1600  Pulse 98 10/21/22 1603  Resp 15 10/21/22 1603  SpO2 100 % 10/21/22 1603  Vitals shown include unvalidated device data.  Last Pain:  Vitals:   10/21/22 1208  TempSrc: Oral  PainSc: 0-No pain      Patients Stated Pain Goal: 5 (26/37/85 8850)  Complications: No notable events documented.

## 2022-10-21 NOTE — H&P (Signed)
Whitney Silva is an 42 y.o. female. Presenting for D&C hysteroscopy and polypectomy.  She had had irreg bleeding for months [place] was sig for uterine masses suggestive of polyps.  She was scheduled in the office and due to high blood pressure it was scheduled in the hospital   Pertinent Gynecological History: Menses: irregular occurring approximately every 3-4 days with spotting approximately 3 days per month Bleeding: dysfunctional uterine bleeding Contraception:  she desires paragard Sexually transmitted diseases: no past history Previous GYN Procedures:  none   Last mammogram: [id number] Last pap: abnormal: AGUS  Date: 10/23 COLPO AND EMNBX DONE.  BXS BENIGN OB History: G2, P2   Menstrual History: Menarche age: 59 Patient's last menstrual period was 10/09/2022 (exact date).    Past Medical History:  Diagnosis Date   Asthma 2014   ALBUTEROL PRN   Asthma    last attack Jan. 2014   Headache(784.0) 2011   TOOK RX;  NONE RECENTLY   Infection 09/2012   INFLUENZA; HOSPITALIZED   Pregnancy induced hypertension 2012   Pregnancy induced hypertension     Past Surgical History:  Procedure Laterality Date   OVARIAN CYST SURGERY     RIGHT OOPHORECTOMY  2004   OVARIAN CYST    Family History  Problem Relation Age of Onset   Early death Mother 81   Kidney disease Mother    Glaucoma Father    Hypertension Father    Asthma Brother    Asthma Daughter    Cancer Paternal Aunt    Cancer Paternal Uncle        LUNG   Asthma Brother     Social History:  reports that she has never smoked. She has never used smokeless tobacco. She reports current alcohol use. She reports that she does not use drugs.  Allergies:  Allergies  Allergen Reactions   Nsaids Hives   Asa [Aspirin] Hives and Rash   Aspirin Hives and Rash    Medications Prior to Admission  Medication Sig Dispense Refill Last Dose   budesonide-formoterol (SYMBICORT) 80-4.5 MCG/ACT inhaler Inhale 2 puffs into the lungs 2  (two) times daily.   10/15/2022   ondansetron (ZOFRAN) 4 MG tablet Take 1 tablet (4 mg total) by mouth every 8 (eight) hours as needed for nausea or vomiting. 20 tablet 0 Past Month   SUMAtriptan (IMITREX) 100 MG tablet Take 100 mg by mouth every 2 (two) hours as needed for migraine. May repeat in 2 hours if headache persists or recurs.   Past Month   topiramate (TOPAMAX) 50 MG tablet 1/2 pill each bedtime x 1 week, then 1 pill nightly x 1 week, then 1-1/2 pills nightly x 1 week, then 2 pills nightly thereafter. 180 tablet 3 Past Month   UBRELVY 50 MG TABS TAKE 50 MG BY MOUTH AS NEEDED (MAY REPEAT ONCE IN 2 HOURS. NO MORE THAN 2 PILLS IN 24 H.). 8 tablet 4 Past Month    ROS  Height 5\' 5"  (1.651 m), weight 59 kg, last menstrual period 10/09/2022, unknown if currently breastfeeding. Physical Exam Physical Examination: General appearance - alert, well appearing, and in no distress Chest - clear to auscultation, no wheezes, rales or rhonchi, symmetric air entry Heart - normal rate and regular rhythm Abdomen - soft, nontender, nondistended, no masses or organomegaly Pelvic - normal external genitalia, vulva, vagina, cervix, uterus and adnexa Extremities - peripheral pulses normal, no pedal edema, no clubbing or cyanosis, Homan's sign negative bilaterally   Results for orders placed or  performed during the hospital encounter of 10/21/22 (from the past 24 hour(s))  Pregnancy, urine POC     Status: Abnormal   Collection Time: 10/21/22 11:38 AM  Result Value Ref Range   Preg Test, Ur POSITIVE (A) NEGATIVE  Pregnancy, urine POC     Status: None   Collection Time: 10/21/22 11:59 AM  Result Value Ref Range   Preg Test, Ur NEGATIVE NEGATIVE    No results found.  Assessment/Plan: Pt for D&C hsteroscopy  with paragard placement.  She under stands the risks are but not limited to bleeding, infection, perforation of the uterus and damage to internal organs She understands paragard will not change her  menses.  She still declined the other IUDS She declined treatment for highblood pressure  Khamya Topp A Daphne Karrer 10/21/2022, 12:02 PM

## 2022-10-21 NOTE — Anesthesia Preprocedure Evaluation (Addendum)
Anesthesia Evaluation  Patient identified by MRN, date of birth, ID band Patient awake    Reviewed: Allergy & Precautions, NPO status , Patient's Chart, lab work & pertinent test results  Airway Mallampati: II  TM Distance: >3 FB Neck ROM: Full    Dental  (+) Dental Advisory Given   Pulmonary asthma    breath sounds clear to auscultation       Cardiovascular hypertension,  Rhythm:Regular Rate:Normal     Neuro/Psych  Headaches    GI/Hepatic negative GI ROS, Neg liver ROS,,,  Endo/Other  negative endocrine ROS    Renal/GU negative Renal ROS     Musculoskeletal   Abdominal   Peds  Hematology negative hematology ROS (+)   Anesthesia Other Findings   Reproductive/Obstetrics                             Anesthesia Physical Anesthesia Plan  ASA: 2  Anesthesia Plan: General   Post-op Pain Management: Tylenol PO (pre-op)*   Induction: Intravenous  PONV Risk Score and Plan: 3 and Dexamethasone, Ondansetron and Treatment may vary due to age or medical condition  Airway Management Planned: LMA  Additional Equipment:   Intra-op Plan:   Post-operative Plan: Extubation in OR  Informed Consent: I have reviewed the patients History and Physical, chart, labs and discussed the procedure including the risks, benefits and alternatives for the proposed anesthesia with the patient or authorized representative who has indicated his/her understanding and acceptance.     Dental advisory given  Plan Discussed with: CRNA  Anesthesia Plan Comments:        Anesthesia Quick Evaluation

## 2022-10-21 NOTE — Op Note (Signed)
DIAGNOSTIC AND/OR OPERATIVE IN-OFFICE HYSTEROSCOPY PROCEDURE    Patient's Name: Whitney Silva  Date of Birth: February 25, 1981  Date of Procedure: 10/15/2022     Preoperative Diagnosis:  menometrorrhagia  with uterine masses  Postoperative Diagnosis: same   Name of Operation/Procedure: hysteroscopy, removal of uterine masses and placement of paragrd IUD  Estimated Hysteroscopic Fluid Deficit: 175cc  EBL: Minimal   [id number] prior to procedure   Procedure: The patient was placed in the dorsal lithotomy position.  She was sterilely prepped and draped.  A Grave's speculum was placed in the vaginal vault.  The anterior lip of the cervix was grasped with a single tooth tenaculum.  Lidocaine 10 cc 2% was used for cervical block  A uterine dilator was then used to dilate the internal cervical os.  The uterus was sounded to 9 cm.  The Hysteroscope was introduced with NS as the distention media.  The findings were four massed,  two on the pts left just lateral to the ostium.  One on the right  in the same position.  3 cm posterior mass.  Hervey Ard curettage was not performed.  The myosure light was used to remove the masses.    The device was retracted and safely removed from the cavity.  The instruments were removed.  Sponge and needle counts were correct at the completion of the procedure.  She tolerated the procedure well and was discharged home without complication. The masses were sent to pathology  Bayley Yarborough A Davidson Palmieri

## 2022-10-22 ENCOUNTER — Encounter (HOSPITAL_BASED_OUTPATIENT_CLINIC_OR_DEPARTMENT_OTHER): Payer: Self-pay | Admitting: Obstetrics and Gynecology

## 2022-10-25 LAB — SURGICAL PATHOLOGY

## 2023-07-23 ENCOUNTER — Encounter: Payer: Self-pay | Admitting: Nurse Practitioner

## 2023-07-23 ENCOUNTER — Ambulatory Visit
Admission: EM | Admit: 2023-07-23 | Discharge: 2023-07-23 | Disposition: A | Discharge status: Home / Self Care | Payer: 59 | Attending: Internal Medicine | Admitting: Internal Medicine

## 2023-07-23 DIAGNOSIS — R1310 Dysphagia, unspecified: Secondary | ICD-10-CM | POA: Diagnosis not present

## 2023-07-23 NOTE — ED Triage Notes (Signed)
Pt presents with c/o  choking feeling after eating. Pt states she usually takes acid reflux medicine. States she has been feeling nauseas x 2 wks. Is only able to have a smoothie.

## 2023-07-23 NOTE — ED Provider Notes (Signed)
[facility]    CSN: [id number] Arrival date & time: 07/23/23  0943      History   Chief Complaint Chief Complaint  Patient presents with   Nausea    HPI Whitney Silva is a 42 y.o. female presents for dysphagia.  Patient reports over the past 1 to 2 weeks she has been having a sensation that food is getting stuck in her esophagus after she eats.  Reports it makes her feel uncomfortable and the only relief is if she makes herself vomit.  She states she has had this intermittently before and was seen by PCP who thought it was related to acid reflux.  She is currently on an over-the-counter acid reflux medication that she takes daily.  She reports she has no difficulty with liquids and normally has been able to do smoothies but today after drinking a smoothie she felt it was also stuck in her esophagus.  Denies aspiration of liquids or foods.  Denies any nausea or vomiting that is not self-induced.  No abdominal pain, fevers.  No other concerns at this time.  HPI  Past Medical History:  Diagnosis Date   Asthma 2014   ALBUTEROL PRN   Asthma    last attack Jan. 2014   Headache(784.0) 2011   TOOK RX;  NONE RECENTLY   Infection 09/2012   INFLUENZA; HOSPITALIZED   Pregnancy induced hypertension 2012   Pregnancy induced hypertension     Patient Active Problem List   Diagnosis Date Noted   Preeclampsia 03/26/2013   History of pre-eclampsia 03/26/2013   Vaginal delivery 03/26/2013   Second-degree perineal laceration, with delivery 03/26/2013   Hx of preeclampsia, prior pregnancy, currently pregnant 12/03/2012   Flu 09/2012 12/03/2012    Past Surgical History:  Procedure Laterality Date   DILATATION & CURETTAGE/HYSTEROSCOPY WITH MYOSURE  10/21/2022   Procedure: DILATATION & CURETTAGE/HYSTEROSCOPY WITH MYOSURE; REMOVAL OF FOUR MASSES;  Surgeon: Jaymes Graff, MD;  Location: [facility];  Service: Gynecology;;   INTRAUTERINE DEVICE (IUD) INSERTION N/A  10/21/2022   Procedure: INTRAUTERINE DEVICE (IUD) INSERTION;  Surgeon: Jaymes Graff, MD;  Location: [facility];  Service: Gynecology;  Laterality: N/A;   OVARIAN CYST SURGERY     RIGHT OOPHORECTOMY  2004   OVARIAN CYST    OB History     Gravida  4   Para  3   Term  2   Preterm  1   AB      Living  2      SAB      IAB      Ectopic      Multiple      Live Births  3            Home Medications    Prior to Admission medications   Medication Sig Start Date End Date Taking? Authorizing Provider  budesonide-formoterol (SYMBICORT) 80-4.5 MCG/ACT inhaler Inhale 2 puffs into the lungs 2 (two) times daily.    [provider]  ondansetron (ZOFRAN) 4 MG tablet Take 1 tablet (4 mg total) by mouth every 8 (eight) hours as needed for nausea or vomiting. 09/26/20   Huston Foley, MD  SUMAtriptan (IMITREX) 100 MG tablet Take 100 mg by mouth every 2 (two) hours as needed for migraine. May repeat in 2 hours if headache persists or recurs.    [provider]  topiramate (TOPAMAX) 50 MG tablet 1/2 pill each bedtime x 1 week, then 1 pill nightly x 1  week, then 1-1/2 pills nightly x 1 week, then 2 pills nightly thereafter. 01/11/21   Lomax, Amy, NP  UBRELVY 50 MG TABS TAKE 50 MG BY MOUTH AS NEEDED (MAY REPEAT ONCE IN 2 HOURS. NO MORE THAN 2 PILLS IN 44 H.). 09/06/21   Lomax, Amy, NP    Family History Family History  Problem Relation Age of Onset   Early death Mother 41   Kidney disease Mother    Glaucoma Father    Hypertension Father    Asthma Brother    Asthma Daughter    Cancer Paternal Aunt    Cancer Paternal Uncle        LUNG   Asthma Brother     Social History Social History   Tobacco Use   Smoking status: Never   Smokeless tobacco: Never  Substance Use Topics   Alcohol use: Yes    Comment: OCC   Drug use: No     Allergies   Nsaids, Asa [aspirin], and Aspirin   Review of Systems Review of Systems  HENT:         Dysphagia      Physical Exam Triage Vital Signs ED Triage Vitals  Encounter Vitals Group     BP 07/23/23 1016 (!) 144/89     Systolic BP Percentile --      Diastolic BP Percentile --      Pulse Rate 07/23/23 1016 98     Resp 07/23/23 1016 17     Temp 07/23/23 1016 98.3 F (36.8 C)     Temp Source 07/23/23 1016 Oral     SpO2 07/23/23 1016 98 %     Weight --      Height --      Head Circumference --      Peak Flow --      Pain Score 07/23/23 1015 0     Pain Loc --      Pain Education --      Exclude from Growth Chart --    No data found.  Updated Vital Signs BP (!) 144/89 (BP Location: Right Arm)   Pulse 98   Temp 98.3 F (36.8 C) (Oral)   Resp 17   LMP 06/29/2023 (Exact Date)   SpO2 98%   Breastfeeding No   Visual Acuity Right Eye Distance:   Left Eye Distance:   Bilateral Distance:    Right Eye Near:   Left Eye Near:    Bilateral Near:     Physical Exam Vitals and nursing note reviewed.  Constitutional:      General: She is not in acute distress.    Appearance: Normal appearance. She is not ill-appearing.  HENT:     Head: Normocephalic and atraumatic.     Mouth/Throat:     Palate: No mass and lesions.     Pharynx: Oropharynx is clear. Uvula midline. No pharyngeal swelling or uvula swelling.  Eyes:     Pupils: Pupils are equal, round, and reactive to light.  Cardiovascular:     Rate and Rhythm: Normal rate.  Pulmonary:     Effort: Pulmonary effort is normal.  Skin:    General: Skin is warm and dry.  Neurological:     General: No focal deficit present.     Mental Status: She is alert and oriented to person, place, and time.  Psychiatric:        Mood and Affect: Mood normal.        Behavior: Behavior normal.  UC Treatments / Results  Labs (all labs ordered are listed, but only abnormal results are displayed) Labs Reviewed - No data to display  EKG   Radiology No results found.  Procedures Procedures (including critical care  time)  Medications Ordered in UC Medications - No data to display  Initial Impression / Assessment and Plan / UC Course  I have reviewed the triage vital signs and the nursing notes.  Pertinent labs & imaging results that were available during my care of the patient were reviewed by me and considered in my medical decision making (see chart for details).     Reviewed exam and sx with patient.  Discussed what she is describing seems consistent with dysphagia and advised her unable to work this up in this setting.  Will refer to gastroenterology for further workup of symptoms/testing.  She is to call them today to make an appointment as soon as she can.  Discussed continuing liquids or blended foods.  Also discussed chewing food thoroughly and eating small portions.  Advised to go to the ER for any worsening symptoms that occur prior to her seeing gastroenterology.  She may also follow-up with her PCP. Final Clinical Impressions(s) / UC Diagnoses   Final diagnoses:  Dysphagia, unspecified type     Discharge Instructions      Please contact gastroenterology to set up an appointment for further evaluation of your symptoms.    ED Prescriptions   None    PDMP not reviewed this encounter.   Radford Pax, NP 07/23/23 1041

## 2023-07-23 NOTE — Discharge Instructions (Signed)
Please contact gastroenterology to set up an appointment for further evaluation of your symptoms.

## 2023-10-20 ENCOUNTER — Encounter: Payer: Self-pay | Admitting: Nurse Practitioner

## 2023-10-20 ENCOUNTER — Ambulatory Visit (INDEPENDENT_AMBULATORY_CARE_PROVIDER_SITE_OTHER): Payer: 59 | Admitting: Nurse Practitioner

## 2023-10-20 ENCOUNTER — Other Ambulatory Visit (INDEPENDENT_AMBULATORY_CARE_PROVIDER_SITE_OTHER): Payer: 59

## 2023-10-20 VITALS — BP 118/84 | HR 107 | Ht 64.5 in | Wt 124.4 lb

## 2023-10-20 DIAGNOSIS — R1310 Dysphagia, unspecified: Secondary | ICD-10-CM

## 2023-10-20 DIAGNOSIS — D509 Iron deficiency anemia, unspecified: Secondary | ICD-10-CM

## 2023-10-20 DIAGNOSIS — N920 Excessive and frequent menstruation with regular cycle: Secondary | ICD-10-CM | POA: Diagnosis not present

## 2023-10-20 LAB — COMPREHENSIVE METABOLIC PANEL
ALT: 14 U/L (ref 0–35)
AST: 14 U/L (ref 0–37)
Albumin: 4.3 g/dL (ref 3.5–5.2)
Alkaline Phosphatase: 63 U/L (ref 39–117)
BUN: 5 mg/dL — ABNORMAL LOW (ref 6–23)
CO2: 22 meq/L (ref 19–32)
Calcium: 9.6 mg/dL (ref 8.4–10.5)
Chloride: 109 meq/L (ref 96–112)
Creatinine, Ser: 0.84 mg/dL (ref 0.40–1.20)
GFR: 86.08 mL/min (ref >60.00)
Glucose, Bld: 77 mg/dL (ref 70–99)
Potassium: 3.1 meq/L — ABNORMAL LOW (ref 3.5–5.1)
Sodium: 140 meq/L (ref 135–145)
Total Bilirubin: 0.3 mg/dL (ref 0.2–1.2)
Total Protein: 7.8 g/dL (ref 6.0–8.3)

## 2023-10-20 LAB — B12 AND FOLATE PANEL
Folate: 7.7 ng/mL (ref >5.9)
Vitamin B-12: 575 pg/mL (ref 211–911)

## 2023-10-20 LAB — IBC + FERRITIN
Ferritin: 44.1 ng/mL (ref 10.0–291.0)
Iron: 44 ug/dL (ref 42–145)
Saturation Ratios: 11 % — ABNORMAL LOW (ref 20.0–50.0)
TIBC: 400.4 ug/dL (ref 250.0–450.0)
Transferrin: 286 mg/dL (ref 212.0–360.0)

## 2023-10-20 LAB — CBC
HCT: 33.1 % — ABNORMAL LOW (ref 36.0–46.0)
Hemoglobin: 10.1 g/dL — ABNORMAL LOW (ref 12.0–15.0)
MCHC: 30.6 g/dL (ref 30.0–36.0)
MCV: 72.5 fL — ABNORMAL LOW (ref 78.0–100.0)
Platelets: 520 10*3/uL — ABNORMAL HIGH (ref 150.0–400.0)
RBC: 4.57 Mil/uL (ref 3.87–5.11)
RDW: 30.4 % — ABNORMAL HIGH (ref 11.5–15.5)
WBC: 4.1 10*3/uL (ref 4.0–10.5)

## 2023-10-20 MED ORDER — FAMOTIDINE 20 MG PO TABS: 20.0000 mg | ORAL_TABLET | Freq: Every day | ORAL | 1 refills | Status: DC

## 2023-10-20 NOTE — Patient Instructions (Addendum)
You have been scheduled for an endoscopy. Please follow written instructions given to you at your visit today.  If you use inhalers (even only as needed), please bring them with you on the day of your procedure. ____________________________________________ Your provider has requested that you go to the basement level for lab work before leaving today. Press "B" on the elevator. The lab is located at the first door on the left as you exit the elevator.  Avoid eating large pieces of bread, meat or rice.  Due to recent changes in healthcare laws, you may see the results of your imaging and laboratory studies on MyChart before your provider has had a chance to review them.  We understand that in some cases there may be results that are confusing or concerning to you. Not all laboratory results come back in the same time frame and the provider may be waiting for multiple results in order to interpret others.  Please give [place] 48 hours in order for your provider to thoroughly review all the results before contacting the office for clarification of your results.   Thank you for trusting me with your gastrointestinal care!   Alcide Evener, CRNP

## 2023-10-20 NOTE — Progress Notes (Signed)
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   She presents to our office today as referred by urgent care provider for further evaluation regarding dysphagia with solid foods which started 06/2023. She was seen at urgent care 07/23/2023 with worsening dysphagia and she was provided with a GI referral.  She developed heartburn and dysphagia with solid foods 06/2023.  She describes having food such as rice which gets stuck to the mid esophagus with associated discomfort which sometimes passes if she drinks water and other times she gags and the stuck food comes out.  She lost 12 to 15 pounds from October to December, gained a few pounds back since then. Infrequent NSAID use. She has a history of asthma for which she takes Symbicort inhaler twice daily.  No recent antibiotics.  She traveled to [place] early 06/2023, she had heartburn and dysphagia prior to her time of travel.  She typically passes a brown formed stool daily.  Since starting oral iron, her stools are green.  No rectal bleeding or black stools.  No known family history of esophageal, gastric or colorectal cancer.  She has significant iron deficiency anemia secondary to painful menstrual cycles with menorrhagia .  She started taking oral iron 07/2023 which was increased to twice daily 09/2023.  Her menstrual cycles last for 14 days, 7 days are painful with heavy flow.  LMP 10/06/2023.  Labs 09/29/2023 per Dr. Concepcion Elk (lab results obtained from patient's phone): Hg7.3. HCT 28.3. WBC 4.7. MCV 68.4. PLT 557.      Latest Ref Rng & Units 10/21/2022   12:25 PM 07/10/2022   12:00 AM  01/04/2022   10:56 AM  CBC  WBC 4.0 - 10.5 K/uL 4.8  4.8  5.4   Hemoglobin 12.0 - 15.0 g/dL 9.1  9.9  08.6   Hematocrit 36.0 - 46.0 % 31.8  33.8  37.5   Platelets 150 - 400 K/uL 529  468  347        Latest Ref Rng & Units 07/10/2022    9:42 AM 07/10/2022   12:00 AM 06/19/2017    9:29 PM  CMP  Glucose 65 - 99 mg/dL 72  CANCELED  99   BUN 7 - 25 mg/dL 9   5   Creatinine 5.78 - 0.99 mg/dL 4.69   6.29   Sodium 528 - 146 mmol/L 138   136   Potassium 3.5 - 5.3 mmol/L 4.0   3.2   Chloride 98 - 110 mmol/L 109   105   CO2 20 - 32 mmol/L 21   21   Calcium 8.6 - 10.2 mg/dL 9.6   9.5   Total Protein 6.1 - 8.1 g/dL 8.0   8.3   Total Bilirubin 0.2 - 1.2 mg/dL 0.3   0.7   Alkaline Phos 38 - 126 U/L   90   AST 10 - 30 U/L 25   40   ALT 6 - 29 U/L 35   83      Past Medical History:  Diagnosis Date   Asthma 2014   ALBUTEROL PRN   Asthma    last attack Jan. 2014   Headache(784.0) 2011   TOOK RX;  NONE RECENTLY  Infection 09/2012   INFLUENZA; HOSPITALIZED   Pregnancy induced hypertension 2012   Pregnancy induced hypertension    Past Surgical History:  Procedure Laterality Date   DILATATION & CURETTAGE/HYSTEROSCOPY WITH MYOSURE  10/21/2022   Procedure: DILATATION & CURETTAGE/HYSTEROSCOPY WITH MYOSURE; REMOVAL OF FOUR MASSES;  Surgeon: Jaymes Graff, MD;  Location: [facility];  Service: Gynecology;;   INTRAUTERINE DEVICE (IUD) INSERTION N/A 10/21/2022   Procedure: INTRAUTERINE DEVICE (IUD) INSERTION;  Surgeon: Jaymes Graff, MD;  Location: [facility];  Service: Gynecology;  Laterality: N/A;   OVARIAN CYST SURGERY     RIGHT OOPHORECTOMY  2004   OVARIAN CYST   Social History: She is originally from [place] in [place] 25 years.  She is married.  She has 1 son and 1 daughter.  She drinks one glass of wine monthly.  Non-smoker.  No drug use.  Family History: Paternal uncle had lung cancer. No known family history of esophageal, gastric or colon cancer.  Brother and  daughter with history of asthma.  Mother died age 22, she had kidney disease.  Father with hypertension and glaucoma.  Allergies  Allergen Reactions   Nsaids Hives   Asa [Aspirin] Hives and Rash   Aspirin Hives and Rash      Outpatient Encounter Medications as of 10/20/2023  Medication Sig   budesonide-formoterol (SYMBICORT) 80-4.5 MCG/ACT inhaler Inhale 2 puffs into the lungs 2 (two) times daily.   ondansetron (ZOFRAN) 4 MG tablet Take 1 tablet (4 mg total) by mouth every 8 (eight) hours as needed for nausea or vomiting.   SUMAtriptan (IMITREX) 100 MG tablet Take 100 mg by mouth every 2 (two) hours as needed for migraine. May repeat in 2 hours if headache persists or recurs.   topiramate (TOPAMAX) 50 MG tablet 1/2 pill each bedtime x 1 week, then 1 pill nightly x 1 week, then 1-1/2 pills nightly x 1 week, then 2 pills nightly thereafter.   UBRELVY 50 MG TABS TAKE 50 MG BY MOUTH AS NEEDED (MAY REPEAT ONCE IN 2 HOURS. NO MORE THAN 2 PILLS IN 24 H.).   No facility-administered encounter medications on file as of 10/20/2023.    REVIEW OF SYSTEMS:  Gen: See HPI. CV: Denies chest pain, palpitations or edema. Resp: Denies cough, shortness of breath of hemoptysis.  GI: See HPI. GU: Denies urinary burning, blood in urine, increased urinary frequency or incontinence. MS: Denies joint pain, muscles aches or weakness. Derm: Denies rash, itchiness, skin lesions or unhealing ulcers. Psych: Denies depression, anxiety, memory loss or confusion. Heme: Denies bruising, easy bleeding. Neuro:  Denies headaches, dizziness or paresthesias. Endo:  Denies any problems with DM, thyroid or adrenal function.  PHYSICAL EXAM: BP 118/84   Pulse (!) 107   Ht 5' 4.5" (1.638 m)   Wt 124 lb 6 oz (56.4 kg)   BMI 21.02 kg/m   General: 43 year old female in no acute distress. Head: Normocephalic and atraumatic. Eyes:  Sclerae non-icteric, conjunctive pink. Ears: Normal auditory acuity. Mouth: Dentition intact.  No ulcers or lesions.  Neck: Supple, no lymphadenopathy or thyromegaly.  Lungs: Clear bilaterally to auscultation without wheezes, crackles or rhonchi. Heart: Regular rate and rhythm. No murmur, rub or gallop appreciated.  Abdomen: Soft, nontender, nondistended. No masses. No hepatosplenomegaly. Normoactive bowel sounds x 4 quadrants.  Rectal: Deferred.  Musculoskeletal: Symmetrical with no gross deformities. Skin: Warm and dry. No rash or lesions on visible extremities. Extremities: No edema. Neurological: Alert oriented x 4, no focal deficits.  Psychological:  Alert and cooperative. Normal mood and affect.  ASSESSMENT AND PLAN:  43 year old female with dysphagia with solid foods only x 3 months. On Symbicort inhaler (contains Budesonide) -EGD to rule out eosinophilic esophagitis, candidiasis esophagitis, GERD, esophageal stricture and UGI malignancy -CBC, IBC + ferritin B12 level and folate  -Hemoglobin must be > 7 prior to proceeding with EGD with MAC -Patient instructed to avoid eating large pieces of bread/meat or rice  Asthma, stable   IDA, secondary to menorrhagia.  No overt GI bleeding.  Patient is scheduled for laparoscopic-assisted vaginal hysterectomy with salpingectomy 11/24/2023.  On ferrous sulfate twice daily.  Labs 09/29/2023 showed a hemoglobin level of 7.3. -Labs as ordered above -May require IV iron, defer to PCP and GYN  Colon cancer screening -Screening colonoscopy due at the age of 36, earlier if IDA persists following hysterectomy    CC:  Fleet Contras, MD

## 2023-11-11 ENCOUNTER — Encounter: Payer: Self-pay | Admitting: Gastroenterology

## 2023-11-11 ENCOUNTER — Other Ambulatory Visit: Payer: Self-pay | Admitting: Nurse Practitioner

## 2023-11-11 ENCOUNTER — Ambulatory Visit: Payer: 59 | Admitting: Gastroenterology

## 2023-11-11 VITALS — BP 138/90 | HR 64 | Temp 97.3°F | Resp 20 | Ht 64.5 in | Wt 124.0 lb

## 2023-11-11 DIAGNOSIS — D509 Iron deficiency anemia, unspecified: Secondary | ICD-10-CM | POA: Diagnosis not present

## 2023-11-11 DIAGNOSIS — R1310 Dysphagia, unspecified: Secondary | ICD-10-CM

## 2023-11-11 DIAGNOSIS — B9681 Helicobacter pylori [H. pylori] as the cause of diseases classified elsewhere: Secondary | ICD-10-CM | POA: Diagnosis not present

## 2023-11-11 DIAGNOSIS — K222 Esophageal obstruction: Secondary | ICD-10-CM | POA: Diagnosis not present

## 2023-11-11 DIAGNOSIS — Z8719 Personal history of other diseases of the digestive system: Secondary | ICD-10-CM

## 2023-11-11 DIAGNOSIS — K209 Esophagitis, unspecified without bleeding: Secondary | ICD-10-CM | POA: Diagnosis not present

## 2023-11-11 DIAGNOSIS — Z8619 Personal history of other infectious and parasitic diseases: Secondary | ICD-10-CM

## 2023-11-11 DIAGNOSIS — K2950 Unspecified chronic gastritis without bleeding: Secondary | ICD-10-CM

## 2023-11-11 DIAGNOSIS — K449 Diaphragmatic hernia without obstruction or gangrene: Secondary | ICD-10-CM | POA: Diagnosis not present

## 2023-11-11 HISTORY — PX: ESOPHAGOGASTRODUODENOSCOPY (EGD) WITH ESOPHAGEAL DILATION: SHX5812

## 2023-11-11 HISTORY — DX: Personal history of other infectious and parasitic diseases: Z86.19

## 2023-11-11 HISTORY — DX: Personal history of other diseases of the digestive system: Z87.19

## 2023-11-11 MED ORDER — SODIUM CHLORIDE 0.9 % IV SOLN: 500.0000 mL | Freq: Once | INTRAVENOUS | Status: DC

## 2023-11-11 NOTE — Patient Instructions (Signed)
 Please read handouts provided. Continue present medications. Await pathology results. Soft diet today, advance to regular diet tomorrow as tolerated. Follow an antireflux regimen.   YOU HAD AN ENDOSCOPIC PROCEDURE TODAY AT THE [facility]:   Refer to the procedure report that was given to you for any specific questions about what was found during the examination.  If the procedure report does not answer your questions, please call your gastroenterologist to clarify.  If you requested that your care partner not be given the details of your procedure findings, then the procedure report has been included in a sealed envelope for you to review at your convenience later.  YOU SHOULD EXPECT: Some feelings of bloating in the abdomen. Passage of more gas than usual.  Walking can help get rid of the air that was put into your GI tract during the procedure and reduce the bloating. If you had a lower endoscopy (such as a colonoscopy or flexible sigmoidoscopy) you may notice spotting of blood in your stool or on the toilet paper. If you underwent a bowel prep for your procedure, you may not have a normal bowel movement for a few days.  Please Note:  You might notice some irritation and congestion in your nose or some drainage.  This is from the oxygen used during your procedure.  There is no need for concern and it should clear up in a day or so.  SYMPTOMS TO REPORT IMMEDIATELY:  Following upper endoscopy (EGD)  Vomiting of blood or coffee ground material  New chest pain or pain under the shoulder blades  Painful or persistently difficult swallowing  New shortness of breath  Fever of 100F or higher  Black, tarry-looking stools  For urgent or emergent issues, a gastroenterologist can be reached at any hour by calling [phone]. Do not use MyChart messaging for urgent concerns.    DIET:  We do recommend a small meal at first, but then you may proceed to your regular diet.  Drink  plenty of fluids but you should avoid alcoholic beverages for 24 hours.  ACTIVITY:  You should plan to take it easy for the rest of today and you should NOT DRIVE or use heavy machinery until tomorrow (because of the sedation medicines used during the test).    FOLLOW UP: Our staff will call the number listed on your records the next business day following your procedure.  We will call around 7:15- 8:00 am to check on you and address any questions or concerns that you may have regarding the information given to you following your procedure. If we do not reach you, we will leave a message.     If any biopsies were taken you will be contacted by phone or by letter within the next 1-3 weeks.  Please call us at [phone] if you have not heard about the biopsies in 3 weeks.    SIGNATURES/CONFIDENTIALITY: You and/or your care partner have signed paperwork which will be entered into your electronic medical record.  These signatures attest to the fact that that the information above on your After Visit Summary has been reviewed and is understood.  Full responsibility of the confidentiality of this discharge information lies with you and/or your care-partner.

## 2023-11-11 NOTE — Progress Notes (Signed)
 Called to room to assist during endoscopic procedure.  Patient ID and intended procedure confirmed with present staff. Received instructions for my participation in the procedure from the performing physician.

## 2023-11-11 NOTE — Progress Notes (Unsigned)
 [facility] History and Physical   Primary Care Physician:  Fleet Contras, MD   Reason for Procedure:  Iron deficiency anemia, dysphagia  Plan:    EGD with possible interventions as needed     HPI: Whitney Silva is a very pleasant 43 y.o. female here for EGD for evaluation of dysphagia and iron deficiency anemia. Please refer to office visit note by Alcide Evener for additional details  The risks and benefits as well as alternatives of endoscopic procedure(s) have been discussed and reviewed. All questions answered. The patient agrees to proceed.    Past Medical History:  Diagnosis Date   Asthma 2014   ALBUTEROL PRN   Asthma    last attack Jan. 2014   Headache(784.0) 2011   TOOK RX;  NONE RECENTLY   Infection 09/2012   INFLUENZA; HOSPITALIZED   Pregnancy induced hypertension 2012   Pregnancy induced hypertension     Past Surgical History:  Procedure Laterality Date   DILATATION & CURETTAGE/HYSTEROSCOPY WITH MYOSURE  10/21/2022   Procedure: DILATATION & CURETTAGE/HYSTEROSCOPY WITH MYOSURE; REMOVAL OF FOUR MASSES;  Surgeon: Jaymes Graff, MD;  Location: [facility];  Service: Gynecology;;   INTRAUTERINE DEVICE (IUD) INSERTION N/A 10/21/2022   Procedure: INTRAUTERINE DEVICE (IUD) INSERTION;  Surgeon: Jaymes Graff, MD;  Location: [facility];  Service: Gynecology;  Laterality: N/A;   OVARIAN CYST SURGERY     RIGHT OOPHORECTOMY  2004   OVARIAN CYST    Prior to Admission medications   Medication Sig Start Date End Date Taking? Authorizing Provider  budesonide-formoterol (SYMBICORT) 80-4.5 MCG/ACT inhaler Inhale 2 puffs into the lungs 2 (two) times daily.   Yes [provider]  famotidine (PEPCID) 20 MG tablet Take 1 tablet (20 mg total) by mouth daily. 10/20/23  Yes Arnaldo Natal, NP  Ferrous Sulfate (IRON) 325 (65 Fe) MG TABS Take 1 tablet by mouth 2 (two) times daily.   Yes [provider]   topiramate (TOPAMAX) 50 MG tablet 1/2 pill each bedtime x 1 week, then 1 pill nightly x 1 week, then 1-1/2 pills nightly x 1 week, then 2 pills nightly thereafter. 01/11/21  Yes Lomax, Amy, NP  ondansetron (ZOFRAN) 4 MG tablet Take 1 tablet (4 mg total) by mouth every 8 (eight) hours as needed for nausea or vomiting. 09/26/20   Huston Foley, MD  SUMAtriptan (IMITREX) 100 MG tablet Take 100 mg by mouth every 2 (two) hours as needed for migraine. May repeat in 2 hours if headache persists or recurs.    [provider]  UBRELVY 50 MG TABS TAKE 50 MG BY MOUTH AS NEEDED (MAY REPEAT ONCE IN 2 HOURS. NO MORE THAN 2 PILLS IN 24 H.). 09/06/21   Lomax, Amy, NP    Current Outpatient Medications  Medication Sig Dispense Refill   budesonide-formoterol (SYMBICORT) 80-4.5 MCG/ACT inhaler Inhale 2 puffs into the lungs 2 (two) times daily.     famotidine (PEPCID) 20 MG tablet Take 1 tablet (20 mg total) by mouth daily. 30 tablet 1   Ferrous Sulfate (IRON) 325 (65 Fe) MG TABS Take 1 tablet by mouth 2 (two) times daily.     topiramate (TOPAMAX) 50 MG tablet 1/2 pill each bedtime x 1 week, then 1 pill nightly x 1 week, then 1-1/2 pills nightly x 1 week, then 2 pills nightly thereafter. 180 tablet 3   ondansetron (ZOFRAN) 4 MG tablet Take 1 tablet (4 mg total) by mouth every 8 (eight) hours as needed for  nausea or vomiting. 20 tablet 0   SUMAtriptan (IMITREX) 100 MG tablet Take 100 mg by mouth every 2 (two) hours as needed for migraine. May repeat in 2 hours if headache persists or recurs.     UBRELVY 50 MG TABS TAKE 50 MG BY MOUTH AS NEEDED (MAY REPEAT ONCE IN 2 HOURS. NO MORE THAN 2 PILLS IN 24 H.). 8 tablet 4   Current Facility-Administered Medications  Medication Dose Route Frequency Provider Last Rate Last Admin   0.9 %  sodium chloride infusion  500 mL Intravenous Once Napoleon Form, MD        Allergies as of 11/11/2023 - Review Complete 11/11/2023  Allergen Reaction Noted   Nsaids Hives  10/01/2012   Asa [aspirin] Hives and Rash 10/22/2012   Aspirin Hives and Rash 10/01/2012    Family History  Problem Relation Age of Onset   Early death Mother 8   Kidney disease Mother    Glaucoma Father    Hypertension Father    Asthma Brother    Asthma Brother    Cancer Paternal Aunt    Cancer Paternal Uncle        LUNG   Asthma Daughter    Colon cancer Neg Hx    Esophageal cancer Neg Hx    Stomach cancer Neg Hx    Rectal cancer Neg Hx     Social History   Socioeconomic History   Marital status: Married    Spouse name: DENNIS   Number of children: 1   Years of education: 16   Highest education level: Not on file  Occupational History   Occupation: HOMEMAKER  Tobacco Use   Smoking status: Never   Smokeless tobacco: Never  Vaping Use   Vaping status: Never Used  Substance and Sexual Activity   Alcohol use: Yes    Comment: OCC   Drug use: No   Sexual activity: Yes    Partners: Male    Birth control/protection: None  Other Topics Concern   Not on file  Social History Narrative   ** Merged History Encounter **       Social Drivers of Corporate investment banker Strain: Not on file  Food Insecurity: Not on file  Transportation Needs: Not on file  Physical Activity: Not on file  Stress: Not on file  Social Connections: Not on file  Intimate Partner Violence: Not on file    Review of Systems:  All other review of systems negative except as mentioned in the HPI.  Physical Exam: Vital signs in last 24 hours: BP (!) 153/89   Pulse 80   Temp (!) 97.3 F (36.3 C)   Ht 5' 4.5" (1.638 m)   Wt 124 lb (56.2 kg)   LMP 11/02/2023   SpO2 100%   BMI 20.96 kg/m  General:   Alert, NAD Lungs:  Clear .   Heart:  Regular rate and rhythm Abdomen:  Soft, nontender and nondistended. Neuro/Psych:  Alert and cooperative. Normal mood and affect. A and O x 3  Reviewed labs, radiology imaging, old records and pertinent past GI work up  Patient is appropriate for  planned procedure(s) and anesthesia in an ambulatory setting   K. Scherry Ran , MD [phone]

## 2023-11-11 NOTE — Progress Notes (Unsigned)
 Sedate, gd SR, tolerated procedure well, VSS, report to RN

## 2023-11-11 NOTE — Op Note (Signed)
 [facility] Patient Name: Whitney Silva Procedure Date: 11/11/2023 9:39 AM MRN: [id number] Endoscopist: Napoleon Form , MD, [id number] Age: 43 Referring MD:  Date of Birth: 1981-04-05 Gender: Female Account #: [id number] Procedure:                Upper GI endoscopy Indications:              Suspected upper gastrointestinal bleeding in                            patient with unexplained iron deficiency anemia,                            Dysphagia Medicines:                Monitored Anesthesia Care Procedure:                Pre-Anesthesia Assessment:                           - Prior to the procedure, a History and Physical                            was performed, and patient medications and                            allergies were reviewed. The patient's tolerance of                            previous anesthesia was also reviewed. The risks                            and benefits of the procedure and the sedation                            options and risks were discussed with the patient.                            All questions were answered, and informed consent                            was obtained. Prior Anticoagulants: The patient has                            taken no anticoagulant or antiplatelet agents. ASA                            Grade Assessment: II - A patient with mild systemic                            disease. After reviewing the risks and benefits,                            the patient was deemed in satisfactory condition to  undergo the procedure.                           After obtaining informed consent, the endoscope was                            passed under direct vision. Throughout the                            procedure, the patient's blood pressure, pulse, and                            oxygen saturations were monitored continuously. The                            GIF [id number] #[id number] was introduced  through the                            mouth, and advanced to the second part of duodenum.                            The upper GI endoscopy was accomplished without                            difficulty. The patient tolerated the procedure                            well. Scope In: Scope Out: Findings:                 The Z-line was regular and was found 38 cm from the                            incisors.                           One benign-appearing, intrinsic mild stenosis was                            found 37 to 38 cm from the incisors. This stenosis                            measured 1.8 cm (inner diameter) x less than one cm                            (in length). The stenosis was traversed. The scope                            was withdrawn. Dilation was performed with a                            Maloney dilator with no resistance at 54 Fr. The  dilation site was examined following endoscope                            reinsertion and showed no change.                           LA Grade A (one or more mucosal breaks less than 5                            mm, not extending between tops of 2 mucosal folds)                            esophagitis with no bleeding was found 34 to 38 cm                            from the incisors. Biopsies were obtained from the                            proximal and distal esophagus with cold forceps for                            histology of suspected eosinophilic esophagitis.                           A 2 cm hiatal hernia was present.                           The stomach was normal.                           The cardia and gastric fundus were normal on                            retroflexion.                           The examined duodenum was normal. Complications:            No immediate complications. Estimated Blood Loss:     Estimated blood loss was minimal. Impression:               - Z-line regular, 38 cm  from the incisors.                           - Benign-appearing esophageal stenosis. Dilated.                           - LA Grade A esophagitis with no bleeding.                           - 2 cm hiatal hernia.                           - Normal stomach.                           -  Normal examined duodenum.                           - Biopsies were taken with a cold forceps for                            evaluation of eosinophilic esophagitis. Recommendation:           - Resume previous diet.                           - Continue present medications.                           - Await pathology results.                           - Follow an antireflux regimen. Napoleon Form, MD 11/11/2023 10:12:41 AM This report has been signed electronically.

## 2023-11-12 ENCOUNTER — Encounter: Payer: Self-pay | Admitting: Gastroenterology

## 2023-11-12 ENCOUNTER — Telehealth: Payer: Self-pay | Admitting: *Deleted

## 2023-11-12 NOTE — Telephone Encounter (Signed)
  Follow up Call-     11/11/2023    9:06 AM  Call back number  Post procedure Call Back phone  # [phone]  Permission to leave phone message Yes     Patient questions:  Message left to call if necessary.

## 2023-11-13 LAB — SURGICAL PATHOLOGY

## 2023-11-24 ENCOUNTER — Ambulatory Visit (HOSPITAL_COMMUNITY): Admit: 2023-11-24 | Payer: 59 | Admitting: Obstetrics and Gynecology

## 2023-11-24 SURGERY — HYSTERECTOMY, VAGINAL, LAPAROSCOPY-ASSISTED, WITH SALPINGECTOMY
Anesthesia: General

## 2023-11-26 ENCOUNTER — Other Ambulatory Visit: Payer: Self-pay | Admitting: Obstetrics and Gynecology

## 2024-01-22 ENCOUNTER — Telehealth: Payer: Self-pay

## 2024-01-22 NOTE — Telephone Encounter (Signed)
 Attempted to reach patient to discuss positive H Pylori results. No answer, left message for patient to return call.

## 2024-01-22 NOTE — Telephone Encounter (Signed)
-----   Message from Kavitha Nandigam sent at 01/22/2024 11:23 AM EDT ----- H.pylori positive gastritis, no evidence of any precancerous changes. Please inform patient the results. Thanks  Please send prescription for Talicia or Pylera X 10 days and PPI BID, if not covered by insurance send Rx for Bismuth 524 mg four times daily, Flagyl 250mg  1 tablet four times daily, Doxycycline 100mg  capsule Twice daily X 10 days along with PPI BID (Lansoprazole, Omeprazole or Nexium).  Will need to confirm eradication in 4-6 weeks by checking H.pylori stool Ag, off PPI for 2 weeks prior to the test.

## 2024-01-27 ENCOUNTER — Ambulatory Visit: Payer: Self-pay

## 2024-01-27 MED ORDER — TALICIA 250-12.5-10 MG PO CPDR: 4.0000 | DELAYED_RELEASE_CAPSULE | Freq: Three times a day (TID) | ORAL | 0 refills | Status: AC

## 2024-01-29 DIAGNOSIS — A048 Other specified bacterial intestinal infections: Secondary | ICD-10-CM

## 2024-02-04 ENCOUNTER — Encounter (HOSPITAL_COMMUNITY): Payer: Self-pay | Admitting: Obstetrics and Gynecology

## 2024-02-04 NOTE — Pre-Procedure Instructions (Signed)
 Surgical Instructions   Your procedure is scheduled on :  Thursday,  02-12-2024. Report to [facility] Main Entrance "A" at 5:30  A.M., then check in with the Admitting office. Any questions or running late day of surgery: call [phone]  Questions prior to your surgery date: call [phone], Monday-Friday, 8am-4pm. If you experience any cold or flu symptoms such as cough, fever, chills, shortness of breath, etc. between now and your scheduled surgery, please notify your surgeon office.    Remember:  Do not eat any food and do not drink any liquids after midnight the night before your surgery.  This includes no water,  candy,  gum,  and  mints.    Take these medicines the morning of surgery with A SIPS OF WATER : Budesonide-formoterol (symbicort) inhaler Pantoprazole (protonix)   May take these medicines IF NEEDED: Ubrelvy  Fluticasone (flonase) nasal spray Cetirizine (zyrtec) Hydroxyzine (atarax) Albuterol  (ventolin ) inhaler Albuterol  (proventil ) nebulizer  ~~ Please bring your Albuterol  rescue inhaler with you day of surgery   One week prior to surgery, STOP taking any Aspirin (unless otherwise instructed by your surgeon) Aleve, Naproxen, Ibuprofen, Motrin, Advil, Goody's, BC's, all herbal medications, fish oil, and non-prescription vitamins.                     Do NOT Smoke (Tobacco/Vaping) and Do Not drink alcohol for 24 hours prior to your procedure.  If you use a CPAP at night, you may bring your mask/headgear for your overnight stay.   You will be asked to remove any contacts, glasses, piercing's, hearing aid's, dentures/partials prior to surgery. Please bring cases for these items if needed.    Patients discharged the day of surgery will not be allowed to drive home, and someone needs to stay with them for 24 hours.  SURGICAL WAITING ROOM VISITATION Patients may have no more than 2 support people in the waiting area - these visitors may rotate.   Pre-op nurse  will coordinate an appropriate time for 1 ADULT support person, who may not rotate, to accompany patient in pre-op.  Children under the age of 40 must have an adult with them who is not the patient and must remain in the main waiting area with an adult.  If the patient needs to stay at the hospital during part of their recovery, the visitor guidelines for inpatient rooms apply.  Please refer to the [facility] website for the visitor guidelines for any additional information.   If you received a COVID test during your pre-op visit  it is requested that you wear a mask when out in public, stay away from anyone that may not be feeling well and notify your surgeon if you develop symptoms. If you have been in contact with anyone that has tested positive in the last 10 days please notify you surgeon.      Pre-operative CHG Bathing Instructions   You can play a key role in reducing the risk of infection after surgery. Your skin needs to be as free of germs as possible. You can reduce the number of germs on your skin by washing with CHG (chlorhexidine gluconate) soap before surgery. CHG is an antiseptic soap that kills germs and continues to kill germs even after washing.   DO NOT use if you have an allergy to chlorhexidine/CHG or antibacterial soaps. If your skin becomes reddened or irritated, stop using the CHG and notify Pre-Op nurse day of surgery.  Please get dial soap or other  antibacterial soap and shower following the instructions below.             TAKE A SHOWER THE NIGHT BEFORE SURGERY AND THE DAY OF SURGERY    Please keep in mind the following:  DO NOT shave, including legs and underarms, 48 hours prior to surgery.   You may shave your face before/day of surgery.  Place clean sheets on your bed the night before surgery Use a clean washcloth (not used since being washed) for each shower. DO NOT sleep with pet's night before surgery.  CHG Shower Instructions:  Wash your face and  private area with normal soap. If you choose to wash your hair, wash first with your normal shampoo.  After you use shampoo/soap, rinse your hair and body thoroughly to remove shampoo/soap residue.  Turn the water OFF and apply half the bottle of CHG soap to a CLEAN washcloth.  Apply CHG soap ONLY FROM YOUR NECK DOWN TO YOUR TOES (washing for 3-5 minutes)  DO NOT use CHG soap on face, private areas, open wounds, or sores.  Pay special attention to the area where your surgery is being performed.  If you are having back surgery, having someone wash your back for you may be helpful. Wait 2 minutes after CHG soap is applied, then you may rinse off the CHG soap.  Pat dry with a clean towel  Put on clean pajamas    Additional instructions for the day of surgery: DO NOT APPLY any lotions, powder,  oils,  deodorants (may use underarm deodorant)  , cologne/  perfumes  or makeup Do not wear jewelry /  piercing's/  metal/  permanent jewelry must be removed prior to arrival day of surgery.  (No plastic piercing) Do not wear nail polish, gel polish, artificial nails, or any other type of covering on natural finger nails (toe nails are okay) Do not bring valuables to the hospital. [facility] is not responsible for valuables/personal belongings. Put on clean/comfortable clothes.  Please brush your teeth.  Ask your nurse before applying any prescription medications to the skin.

## 2024-02-04 NOTE — Progress Notes (Signed)
 Spoke w/ via phone for pre-op interview---  pt Lab needs dos----  urine preg       Lab results------  lab appt 02-10-2024 @ 0930 getting CBC/ BMP/ T&S COVID test -----patient states asymptomatic no test needed Arrive at -------  0530 on 02-12-2024 NPO after MN w/ exception sips of water w/ meds Pre-Surgery Ensure or G2:  n/a  Med rec completed Medications to take morning of surgery ----- symbicort inhaler, protonix Diabetic medication ----- n/a  GLP1 agonist last dose: n/a GLP1 instructions:  Patient instructed no nail polish to be worn day of surgery Patient instructed to bring photo id and insurance card day of surgery Patient aware to have Driver (ride ) / caregiver    for 24 hours after surgery - husband, Whitney Silva Patient Special Instructions ----- will pick up bag w/ soap and written instructions at lab appt Pre-Op special Instructions ----- n/a  Patient verbalized understanding of instructions that were given at this phone interview. Patient denies chest pain, sob, fever, cough at the interview.

## 2024-02-10 ENCOUNTER — Encounter (HOSPITAL_COMMUNITY)
Admission: RE | Admit: 2024-02-10 | Discharge: 2024-02-10 | Disposition: A | Discharge status: Home / Self Care | Source: Ambulatory Visit | Attending: Obstetrics and Gynecology | Admitting: Obstetrics and Gynecology

## 2024-02-10 DIAGNOSIS — Z01812 Encounter for preprocedural laboratory examination: Secondary | ICD-10-CM | POA: Diagnosis present

## 2024-02-10 LAB — CBC
HCT: 33.8 % — ABNORMAL LOW (ref 36.0–46.0)
Hemoglobin: 10.1 g/dL — ABNORMAL LOW (ref 12.0–15.0)
MCH: 23.5 pg — ABNORMAL LOW (ref 26.0–34.0)
MCHC: 29.9 g/dL — ABNORMAL LOW (ref 30.0–36.0)
MCV: 78.6 fL — ABNORMAL LOW (ref 80.0–100.0)
Platelets: 426 10*3/uL — ABNORMAL HIGH (ref 150–400)
RBC: 4.3 MIL/uL (ref 3.87–5.11)
RDW: 14.9 % (ref 11.5–15.5)
WBC: 3.9 10*3/uL — ABNORMAL LOW (ref 4.0–10.5)
nRBC: 0 % (ref 0.0–0.2)

## 2024-02-10 LAB — BASIC METABOLIC PANEL WITH GFR
Anion gap: 5 (ref 5–15)
BUN: 5 mg/dL — ABNORMAL LOW (ref 6–20)
CO2: 22 mmol/L (ref 22–32)
Calcium: 9.1 mg/dL (ref 8.9–10.3)
Chloride: 112 mmol/L — ABNORMAL HIGH (ref 98–111)
Creatinine, Ser: 0.69 mg/dL (ref 0.44–1.00)
GFR, Estimated: >60 mL/min (ref >60)
Glucose, Bld: 100 mg/dL — ABNORMAL HIGH (ref 70–99)
Potassium: 3.4 mmol/L — ABNORMAL LOW (ref 3.5–5.1)
Sodium: 139 mmol/L (ref 135–145)

## 2024-02-10 LAB — TYPE AND SCREEN
ABO/RH(D): B POS
Antibody Screen: NEGATIVE

## 2024-02-11 NOTE — Progress Notes (Signed)
 Patient returned call to verify time change

## 2024-02-11 NOTE — Progress Notes (Signed)
 Left voicemail to arrive at 0730 instead of 0530. Nothing to eat or drink after MN. To call back at [phone] to verify time change

## 2024-02-12 ENCOUNTER — Encounter (HOSPITAL_COMMUNITY)
Admission: RE | Disposition: A | Discharge status: Home / Self Care | Payer: Self-pay | Source: Home / Self Care | Attending: Obstetrics and Gynecology

## 2024-02-12 ENCOUNTER — Ambulatory Visit (HOSPITAL_COMMUNITY): Admitting: Anesthesiology

## 2024-02-12 ENCOUNTER — Observation Stay (HOSPITAL_COMMUNITY)
Admission: RE | Admit: 2024-02-12 | Discharge: 2024-02-13 | Disposition: A | Discharge status: Home / Self Care | Attending: Obstetrics and Gynecology | Admitting: Obstetrics and Gynecology

## 2024-02-12 ENCOUNTER — Other Ambulatory Visit: Payer: Self-pay

## 2024-02-12 ENCOUNTER — Encounter (HOSPITAL_COMMUNITY): Payer: Self-pay | Admitting: Obstetrics and Gynecology

## 2024-02-12 DIAGNOSIS — Z90710 Acquired absence of both cervix and uterus: Secondary | ICD-10-CM | POA: Diagnosis present

## 2024-02-12 DIAGNOSIS — D250 Submucous leiomyoma of uterus: Secondary | ICD-10-CM

## 2024-02-12 DIAGNOSIS — J45909 Unspecified asthma, uncomplicated: Secondary | ICD-10-CM

## 2024-02-12 DIAGNOSIS — D259 Leiomyoma of uterus, unspecified: Secondary | ICD-10-CM | POA: Diagnosis present

## 2024-02-12 DIAGNOSIS — N921 Excessive and frequent menstruation with irregular cycle: Secondary | ICD-10-CM | POA: Insufficient documentation

## 2024-02-12 DIAGNOSIS — D251 Intramural leiomyoma of uterus: Secondary | ICD-10-CM

## 2024-02-12 DIAGNOSIS — I10 Essential (primary) hypertension: Secondary | ICD-10-CM

## 2024-02-12 DIAGNOSIS — Z79899 Other long term (current) drug therapy: Secondary | ICD-10-CM | POA: Insufficient documentation

## 2024-02-12 DIAGNOSIS — Z01818 Encounter for other preprocedural examination: Principal | ICD-10-CM

## 2024-02-12 HISTORY — DX: Presence of spectacles and contact lenses: Z97.3

## 2024-02-12 HISTORY — DX: Allergic rhinitis, unspecified: J30.9

## 2024-02-12 HISTORY — DX: Diaphragmatic hernia without obstruction or gangrene: K44.9

## 2024-02-12 HISTORY — PX: CYSTOSCOPY: SHX5120

## 2024-02-12 HISTORY — DX: Leiomyoma of uterus, unspecified: D25.9

## 2024-02-12 HISTORY — DX: Iron deficiency anemia, unspecified: D50.9

## 2024-02-12 HISTORY — PX: LAPAROSCOPIC VAGINAL HYSTERECTOMY WITH SALPINGECTOMY: SHX6680

## 2024-02-12 LAB — POCT PREGNANCY, URINE: Preg Test, Ur: NEGATIVE

## 2024-02-12 SURGERY — HYSTERECTOMY, VAGINAL, LAPAROSCOPY-ASSISTED, WITH SALPINGECTOMY
Anesthesia: General | Site: Vagina

## 2024-02-12 MED ORDER — FLUORESCEIN SODIUM 10 % IV SOLN
INTRAVENOUS | Status: DC | PRN
  Administered 2024-02-12: 25 mg via INTRAVENOUS

## 2024-02-12 MED ORDER — SUGAMMADEX SODIUM 200 MG/2ML IV SOLN
INTRAVENOUS | Status: DC | PRN
  Administered 2024-02-12: 200 mg via INTRAVENOUS

## 2024-02-12 MED ORDER — PHENYLEPHRINE 80 MCG/ML (10ML) SYRINGE FOR IV PUSH (FOR BLOOD PRESSURE SUPPORT)
PREFILLED_SYRINGE | INTRAVENOUS | Status: AC
  Filled 2024-02-12: qty 10

## 2024-02-12 MED ORDER — NALOXONE HCL 0.4 MG/ML IJ SOLN: 0.4000 mg | INTRAMUSCULAR | Status: DC | PRN

## 2024-02-12 MED ORDER — SIMETHICONE 80 MG PO CHEW
80.0000 mg | CHEWABLE_TABLET | Freq: Four times a day (QID) | ORAL | Status: DC | PRN
  Administered 2024-02-13: 80 mg via ORAL
  Filled 2024-02-12: qty 1

## 2024-02-12 MED ORDER — ACETAMINOPHEN 500 MG PO TABS
ORAL_TABLET | ORAL | Status: AC
  Filled 2024-02-12: qty 2

## 2024-02-12 MED ORDER — LACTATED RINGERS IV SOLN: INTRAVENOUS | Status: DC

## 2024-02-12 MED ORDER — MIDAZOLAM HCL 2 MG/2ML IJ SOLN
INTRAMUSCULAR | Status: AC
  Filled 2024-02-12: qty 2

## 2024-02-12 MED ORDER — MENTHOL 3 MG MT LOZG: 1.0000 | LOZENGE | OROMUCOSAL | Status: DC | PRN

## 2024-02-12 MED ORDER — ORAL CARE MOUTH RINSE
15.0000 mL | Freq: Once | OROMUCOSAL | Status: AC
Start: 2024-02-12 — End: 2024-02-12

## 2024-02-12 MED ORDER — SODIUM CHLORIDE 0.9 % IR SOLN
Status: DC | PRN
  Administered 2024-02-12 (×2): 1000 mL

## 2024-02-12 MED ORDER — OXYCODONE HCL 5 MG/5ML PO SOLN: 5.0000 mg | Freq: Once | ORAL | Status: DC | PRN

## 2024-02-12 MED ORDER — ONDANSETRON HCL 4 MG/2ML IJ SOLN
INTRAMUSCULAR | Status: DC | PRN
  Administered 2024-02-12: 4 mg via INTRAVENOUS

## 2024-02-12 MED ORDER — KETAMINE HCL 10 MG/ML IJ SOLN
INTRAMUSCULAR | Status: DC | PRN
  Administered 2024-02-12: 30 mg via INTRAVENOUS

## 2024-02-12 MED ORDER — ROCURONIUM BROMIDE 10 MG/ML (PF) SYRINGE
PREFILLED_SYRINGE | INTRAVENOUS | Status: DC | PRN
  Administered 2024-02-12: 40 mg via INTRAVENOUS
  Administered 2024-02-12 (×3): 20 mg via INTRAVENOUS

## 2024-02-12 MED ORDER — FENTANYL CITRATE (PF) 100 MCG/2ML IJ SOLN
INTRAMUSCULAR | Status: AC
  Filled 2024-02-12: qty 2

## 2024-02-12 MED ORDER — MIDAZOLAM HCL 5 MG/5ML IJ SOLN
INTRAMUSCULAR | Status: DC | PRN
  Administered 2024-02-12: 2 mg via INTRAVENOUS

## 2024-02-12 MED ORDER — BUPIVACAINE HCL (PF) 0.25 % IJ SOLN
INTRAMUSCULAR | Status: AC
  Filled 2024-02-12: qty 30

## 2024-02-12 MED ORDER — FLUORESCEIN SODIUM 10 % IV SOLN
INTRAVENOUS | Status: AC
  Filled 2024-02-12: qty 5

## 2024-02-12 MED ORDER — PROPOFOL 10 MG/ML IV BOLUS
INTRAVENOUS | Status: DC | PRN
  Administered 2024-02-12: 150 mg via INTRAVENOUS

## 2024-02-12 MED ORDER — ONDANSETRON HCL 4 MG/2ML IJ SOLN: 4.0000 mg | Freq: Four times a day (QID) | INTRAMUSCULAR | Status: DC | PRN

## 2024-02-12 MED ORDER — SODIUM CHLORIDE 0.9% FLUSH
9.0000 mL | INTRAVENOUS | Status: DC | PRN
Start: 2024-02-12 — End: 2024-02-13

## 2024-02-12 MED ORDER — LIDOCAINE 2% (20 MG/ML) 5 ML SYRINGE
INTRAMUSCULAR | Status: DC | PRN
  Administered 2024-02-12: 60 mg via INTRAVENOUS

## 2024-02-12 MED ORDER — HEMOSTATIC AGENTS (NO CHARGE) OPTIME
TOPICAL | Status: DC | PRN
  Administered 2024-02-12: 1 via TOPICAL

## 2024-02-12 MED ORDER — OXYCODONE-ACETAMINOPHEN 5-325 MG PO TABS: 1.0000 | ORAL_TABLET | ORAL | Status: DC | PRN

## 2024-02-12 MED ORDER — CHLORHEXIDINE GLUCONATE 0.12 % MT SOLN
OROMUCOSAL | Status: AC
  Filled 2024-02-12: qty 15

## 2024-02-12 MED ORDER — PHENYLEPHRINE 80 MCG/ML (10ML) SYRINGE FOR IV PUSH (FOR BLOOD PRESSURE SUPPORT)
PREFILLED_SYRINGE | INTRAVENOUS | Status: DC | PRN
  Administered 2024-02-12: 80 ug via INTRAVENOUS
  Administered 2024-02-12: 160 ug via INTRAVENOUS
  Administered 2024-02-12: 80 ug via INTRAVENOUS

## 2024-02-12 MED ORDER — OXYCODONE HCL 5 MG PO TABS: 5.0000 mg | ORAL_TABLET | Freq: Once | ORAL | Status: DC | PRN

## 2024-02-12 MED ORDER — POVIDONE-IODINE 10 % EX SWAB
2.0000 | Freq: Once | CUTANEOUS | Status: AC
  Administered 2024-02-12: 2 via TOPICAL

## 2024-02-12 MED ORDER — LIDOCAINE 2% (20 MG/ML) 5 ML SYRINGE
INTRAMUSCULAR | Status: AC
  Filled 2024-02-12: qty 5

## 2024-02-12 MED ORDER — FENTANYL CITRATE (PF) 250 MCG/5ML IJ SOLN
INTRAMUSCULAR | Status: AC
Start: 2024-02-12 — End: ?
  Filled 2024-02-12: qty 5

## 2024-02-12 MED ORDER — PROPOFOL 10 MG/ML IV BOLUS
INTRAVENOUS | Status: AC
  Filled 2024-02-12: qty 20

## 2024-02-12 MED ORDER — OXYCODONE HCL 5 MG PO TABS
5.0000 mg | ORAL_TABLET | ORAL | Status: DC | PRN
  Administered 2024-02-12 – 2024-02-13 (×2): 5 mg via ORAL
  Administered 2024-02-13: 10 mg via ORAL
  Filled 2024-02-12: qty 2
  Filled 2024-02-12 (×2): qty 1

## 2024-02-12 MED ORDER — ONDANSETRON HCL 4 MG/2ML IJ SOLN
INTRAMUSCULAR | Status: AC
  Filled 2024-02-12: qty 2

## 2024-02-12 MED ORDER — DEXAMETHASONE SODIUM PHOSPHATE 10 MG/ML IJ SOLN
INTRAMUSCULAR | Status: AC
  Filled 2024-02-12: qty 1

## 2024-02-12 MED ORDER — 0.9 % SODIUM CHLORIDE (POUR BTL) OPTIME
TOPICAL | Status: DC | PRN
  Administered 2024-02-12: 1000 mL

## 2024-02-12 MED ORDER — SODIUM CHLORIDE (PF) 0.9 % IJ SOLN
INTRAMUSCULAR | Status: AC
  Filled 2024-02-12: qty 100

## 2024-02-12 MED ORDER — FENTANYL CITRATE (PF) 100 MCG/2ML IJ SOLN
25.0000 ug | INTRAMUSCULAR | Status: DC | PRN
  Administered 2024-02-12: 25 ug via INTRAVENOUS
  Administered 2024-02-12: 50 ug via INTRAVENOUS
  Administered 2024-02-12: 25 ug via INTRAVENOUS

## 2024-02-12 MED ORDER — DEXAMETHASONE SODIUM PHOSPHATE 10 MG/ML IJ SOLN
INTRAMUSCULAR | Status: DC | PRN
  Administered 2024-02-12: 10 mg via INTRAVENOUS

## 2024-02-12 MED ORDER — BUPIVACAINE HCL (PF) 0.5 % IJ SOLN
INTRAMUSCULAR | Status: AC
  Filled 2024-02-12: qty 30

## 2024-02-12 MED ORDER — VASOPRESSIN 20 UNIT/ML IV SOLN
INTRAVENOUS | Status: DC | PRN
  Administered 2024-02-12: 30 mL via INTRAMUSCULAR

## 2024-02-12 MED ORDER — ACETAMINOPHEN 500 MG PO TABS
1000.0000 mg | ORAL_TABLET | Freq: Four times a day (QID) | ORAL | Status: DC
Start: 2024-02-12 — End: 2024-02-13
  Administered 2024-02-12 – 2024-02-13 (×3): 1000 mg via ORAL
  Filled 2024-02-12 (×3): qty 2

## 2024-02-12 MED ORDER — ESTRADIOL 0.1 MG/GM VA CREA
TOPICAL_CREAM | VAGINAL | Status: DC | PRN
Start: 2024-02-12 — End: 2024-02-12
  Administered 2024-02-12: 1 via VAGINAL

## 2024-02-12 MED ORDER — CEFAZOLIN SODIUM-DEXTROSE 2-4 GM/100ML-% IV SOLN
2.0000 g | INTRAVENOUS | Status: AC
  Administered 2024-02-12: 2 g via INTRAVENOUS

## 2024-02-12 MED ORDER — ESTRADIOL 0.1 MG/GM VA CREA
TOPICAL_CREAM | VAGINAL | Status: AC
  Filled 2024-02-12: qty 42.5

## 2024-02-12 MED ORDER — KETAMINE HCL 50 MG/5ML IJ SOSY
PREFILLED_SYRINGE | INTRAMUSCULAR | Status: AC
  Filled 2024-02-12: qty 5

## 2024-02-12 MED ORDER — CHLORHEXIDINE GLUCONATE 0.12 % MT SOLN
15.0000 mL | Freq: Once | OROMUCOSAL | Status: AC
  Administered 2024-02-12: 15 mL via OROMUCOSAL

## 2024-02-12 MED ORDER — AMISULPRIDE (ANTIEMETIC) 5 MG/2ML IV SOLN: 10.0000 mg | Freq: Once | INTRAVENOUS | Status: DC | PRN

## 2024-02-12 MED ORDER — ROCURONIUM BROMIDE 10 MG/ML (PF) SYRINGE
PREFILLED_SYRINGE | INTRAVENOUS | Status: AC
  Filled 2024-02-12: qty 10

## 2024-02-12 MED ORDER — FENTANYL CITRATE (PF) 100 MCG/2ML IJ SOLN
INTRAMUSCULAR | Status: DC | PRN
  Administered 2024-02-12: 100 ug via INTRAVENOUS
  Administered 2024-02-12 (×2): 50 ug via INTRAVENOUS

## 2024-02-12 MED ORDER — VASOPRESSIN 20 UNIT/ML IV SOLN
INTRAVENOUS | Status: AC
  Filled 2024-02-12: qty 1

## 2024-02-12 MED ORDER — ACETAMINOPHEN 500 MG PO TABS
1000.0000 mg | ORAL_TABLET | Freq: Once | ORAL | Status: AC
  Administered 2024-02-12: 1000 mg via ORAL

## 2024-02-12 MED ORDER — DIPHENHYDRAMINE HCL 12.5 MG/5ML PO ELIX
12.5000 mg | ORAL_SOLUTION | Freq: Four times a day (QID) | ORAL | Status: DC | PRN
Start: 2024-02-12 — End: 2024-02-13

## 2024-02-12 MED ORDER — CEFAZOLIN SODIUM-DEXTROSE 3-4 GM/150ML-% IV SOLN: 3.0000 g | INTRAVENOUS | Status: DC

## 2024-02-12 MED ORDER — BUPIVACAINE HCL (PF) 0.25 % IJ SOLN
INTRAMUSCULAR | Status: DC | PRN
Start: 2024-02-12 — End: 2024-02-12
  Administered 2024-02-12: 7 mL

## 2024-02-12 MED ORDER — CEFAZOLIN SODIUM-DEXTROSE 2-4 GM/100ML-% IV SOLN
INTRAVENOUS | Status: AC
  Filled 2024-02-12: qty 100

## 2024-02-12 MED ORDER — FLUORESCEIN SODIUM 10 % IV SOLN
INTRAVENOUS | Status: AC
Start: 2024-02-12 — End: ?
  Filled 2024-02-12: qty 5

## 2024-02-12 MED ORDER — SCOPOLAMINE 1 MG/3DAYS TD PT72
1.0000 | MEDICATED_PATCH | TRANSDERMAL | Status: DC
  Administered 2024-02-12: 1.5 mg via TRANSDERMAL
  Filled 2024-02-12: qty 1

## 2024-02-12 MED ORDER — HYDROMORPHONE 1 MG/ML IV SOLN
INTRAVENOUS | Status: DC
  Administered 2024-02-12: 30 mg via INTRAVENOUS
  Administered 2024-02-12: 1.1 mg via INTRAVENOUS
  Administered 2024-02-12: 1.6 mg via INTRAVENOUS
  Administered 2024-02-13: 0.4 mg via INTRAVENOUS
  Administered 2024-02-13: 0.6 mg via INTRAVENOUS
  Filled 2024-02-12: qty 30

## 2024-02-12 MED ORDER — DIPHENHYDRAMINE HCL 50 MG/ML IJ SOLN: 12.5000 mg | Freq: Four times a day (QID) | INTRAMUSCULAR | Status: DC | PRN

## 2024-02-12 MED ORDER — METOCLOPRAMIDE HCL 10 MG PO TABS
10.0000 mg | ORAL_TABLET | Freq: Three times a day (TID) | ORAL | Status: DC
  Administered 2024-02-12 – 2024-02-13 (×3): 10 mg via ORAL
  Filled 2024-02-12 (×3): qty 1

## 2024-02-12 SURGICAL SUPPLY — 60 items
APPLICATOR ARISTA FLEXITIP XL (MISCELLANEOUS) ×1 IMPLANT
COVER BACK TABLE 60X90IN (DRAPES) ×3 IMPLANT
COVER MAYO STAND STRL (DRAPES) ×6 IMPLANT
DERMABOND ADVANCED .7 DNX12 (GAUZE/BANDAGES/DRESSINGS) ×3 IMPLANT
DRAPE SURG IRRIG POUCH 19X23 (DRAPES) ×3 IMPLANT
DRSG OPSITE POSTOP 4X10 (GAUZE/BANDAGES/DRESSINGS) ×3 IMPLANT
DURAPREP 26ML APPLICATOR (WOUND CARE) ×3 IMPLANT
ELECTRODE REM PT RTRN 9FT ADLT (ELECTROSURGICAL) ×3 IMPLANT
FORCEPS CUTTING 33CM 5MM (CUTTING FORCEPS) ×1 IMPLANT
GAUZE STRIP PACKING 2INX5YD (MISCELLANEOUS) ×1 IMPLANT
GLOVE BIO SURGEON STRL SZ 6.5 (GLOVE) ×6 IMPLANT
GLOVE BIOGEL PI IND STRL 6 (GLOVE) ×4 IMPLANT
GLOVE BIOGEL PI IND STRL 7.0 (GLOVE) ×17 IMPLANT
GLOVE BIOGEL PI IND STRL 7.5 (GLOVE) ×2 IMPLANT
GLOVE BIOGEL PI MICRO STRL 7 (GLOVE) ×1 IMPLANT
GLOVE SURG SS PI 7.5 STRL IVOR (GLOVE) ×1 IMPLANT
GLOVE SURG UNDER POLY LF SZ7 (GLOVE) ×12 IMPLANT
GOWN STRL REUS W/ TWL LRG LVL3 (GOWN DISPOSABLE) ×8 IMPLANT
GOWN STRL REUS W/ TWL XL LVL3 (GOWN DISPOSABLE) ×2 IMPLANT
GOWN STRL SURGICAL XL XLNG (GOWN DISPOSABLE) ×3 IMPLANT
HEMOSTAT ARISTA ABSORB 3G PWDR (HEMOSTASIS) ×1 IMPLANT
HIBICLENS CHG 4% 4OZ BTL (MISCELLANEOUS) ×4 IMPLANT
HOLDER FOLEY CATH W/STRAP (MISCELLANEOUS) ×1 IMPLANT
IRRIGATION SUCT STRKRFLW 2 WTP (MISCELLANEOUS) ×1 IMPLANT
IV NS 1000ML BAXH (IV SOLUTION) ×2 IMPLANT
KIT PINK PAD W/HEAD ARE REST (MISCELLANEOUS) ×3 IMPLANT
KIT PINK PAD W/HEAD ARM REST (MISCELLANEOUS) ×3 IMPLANT
KIT TURNOVER KIT B (KITS) ×3 IMPLANT
MANIFOLD NEPTUNE II (INSTRUMENTS) ×1 IMPLANT
NDL HYPO 22X1.5 SAFETY MO (MISCELLANEOUS) IMPLANT
NDL MAYO CATGUT SZ4 TPR NDL (NEEDLE) ×2 IMPLANT
NEEDLE HYPO 22X1.5 SAFETY MO (MISCELLANEOUS) ×3 IMPLANT
NEEDLE MAYO CATGUT SZ4 (NEEDLE) ×3 IMPLANT
NS IRRIG 1000ML POUR BTL (IV SOLUTION) ×3 IMPLANT
PACK LAVH (CUSTOM PROCEDURE TRAY) ×3 IMPLANT
PAD ARMBOARD POSITIONER FOAM (MISCELLANEOUS) ×3 IMPLANT
PAD OB MATERNITY 11 LF (PERSONAL CARE ITEMS) ×3 IMPLANT
POUCH LAPAROSCOPIC INSTRUMENT (MISCELLANEOUS) ×3 IMPLANT
SET CYSTO W/LG BORE CLAMP LF (SET/KITS/TRAYS/PACK) ×3 IMPLANT
SET TUBE SMOKE EVAC HIGH FLOW (TUBING) ×3 IMPLANT
SHEET LAVH (DRAPES) ×3 IMPLANT
SLEEVE Z-THREAD 5X100MM (TROCAR) ×4 IMPLANT
SOLUTION ELECTROSURG ANTI STCK (MISCELLANEOUS) ×3 IMPLANT
SPECIMEN JAR MEDIUM (MISCELLANEOUS) ×3 IMPLANT
SPIKE FLUID TRANSFER (MISCELLANEOUS) ×5 IMPLANT
STRIP CLOSURE SKIN 1/2X4 (GAUZE/BANDAGES/DRESSINGS) ×3 IMPLANT
SUT CHROMIC 0 CT 1 (SUTURE) ×3 IMPLANT
SUT CHROMIC 2 0 UR 5 27 (SUTURE) ×1 IMPLANT
SUT MNCRL AB 3-0 PS2 27 (SUTURE) ×6 IMPLANT
SUT VIC AB 0 CT1 18XCR BRD8 (SUTURE) ×8 IMPLANT
SUT VIC AB 0 CT1 27XBRD ANBCTR (SUTURE) ×10 IMPLANT
SUT VIC AB 2-0 SH 27XBRD (SUTURE) ×3 IMPLANT
SUT VICRYL 0 TIES 12 18 (SUTURE) ×3 IMPLANT
SUT VICRYL 0 UR6 27IN ABS (SUTURE) ×4 IMPLANT
SYR BULB EAR ULCER 3OZ GRN STR (SYRINGE) ×1 IMPLANT
SYR BULB IRRIG 60ML STRL (SYRINGE) ×3 IMPLANT
TOWEL GREEN STERILE FF (TOWEL DISPOSABLE) ×6 IMPLANT
TRAY FOLEY W/BAG SLVR 14FR (SET/KITS/TRAYS/PACK) ×3 IMPLANT
TROCAR BALLN 12MMX100 BLUNT (TROCAR) ×3 IMPLANT
UNDERPAD 30X36 HEAVY ABSORB (UNDERPADS AND DIAPERS) ×3 IMPLANT

## 2024-02-12 NOTE — Anesthesia Preprocedure Evaluation (Addendum)
 Anesthesia Evaluation  Patient identified by MRN, date of birth, ID band Patient awake    Reviewed: Allergy & Precautions, NPO status , Patient's Chart, lab work & pertinent test results  Airway Mallampati: II  TM Distance: >3 FB Neck ROM: Full    Dental  (+) Dental Advisory Given   Pulmonary asthma    breath sounds clear to auscultation       Cardiovascular hypertension, Pt. on medications  Rhythm:Regular Rate:Normal     Neuro/Psych  Headaches    GI/Hepatic Neg liver ROS, hiatal hernia,,,  Endo/Other  negative endocrine ROS    Renal/GU negative Renal ROS     Musculoskeletal   Abdominal   Peds  Hematology  (+) Blood dyscrasia, anemia   Anesthesia Other Findings   Reproductive/Obstetrics                             Anesthesia Physical Anesthesia Plan  ASA: 3  Anesthesia Plan: General   Post-op Pain Management: Tylenol  PO (pre-op)* and Ketamine  IV*   Induction: Intravenous  PONV Risk Score and Plan: 4 or greater and Dexamethasone , Ondansetron , Midazolam , Treatment may vary due to age or medical condition and Scopolamine  patch - Pre-op  Airway Management Planned: Oral ETT  Additional Equipment:   Intra-op Plan:   Post-operative Plan: Extubation in OR  Informed Consent: I have reviewed the patients History and Physical, chart, labs and discussed the procedure including the risks, benefits and alternatives for the proposed anesthesia with the patient or authorized representative who has indicated his/her understanding and acceptance.     Dental advisory given  Plan Discussed with: CRNA  Anesthesia Plan Comments:        Anesthesia Quick Evaluation

## 2024-02-12 NOTE — Op Note (Signed)
 Diagnosis: menometrorrhagia.  fibroids  Postop Diagnosis:same   Procedure: LAVH, B salpingectmy LOA, Cystoscopy  Anesthesia: General   Anesthesiologist:   Attending: Norville Beery, MD   Assistant: Renea Carrion MD  Findings: 14 week size fibroid uterus.  Right ovary sugically absent.  Posterior culdesac adhesions.    Pathology: uterus and cervix and feft fallopian tube  Fluids: 1400cccrystalloid  UOP: 500cc  EBL: 150 cc  Complications:none  Procedure: The patient was taken to the operating room, placed under general anesthesia and prepped and draped in the normal sterile fashion. A Foley catheter was placed in the bladder. A weighted speculum and vaginal retractors were placed in the vagina. Tenaculum was placed on the anterior lip of the cervix.  A hulka manipulator was placed in the uterus. Attention was then turned to the abdomen. A 10 mm infraumbilical incision was made with the scalpel after 5 cc of 25% percent Marcaine  was used for local anesthesia. The subcutaneous tissue was dissected and the fascia was incised with the knife. A purse string stitch was placed in the fascia and Hassan placed into the intra-abdominal cavity and anchored to the suture. Intraabdominal placement was confirmed with the laparoscope.  Two 5 mm trochars were placed in the right and left lower quadrants under direct visualization with the laparoscope.   .   Both round ligaments were cauterized and cut with the gyrus bipolar cautery as well and the bladder flap created with the gyrus and hydrodisection using the nigat and removed away from the uterus. The bladder adhesions were also lysed sharply.   The left fallopian tube was cauterized and removed.    The left uterine ovarian ligament was then cauterized and cut.   The right ovary was surgically absent.  LOA took place with the bowel in the posterior culdesac.   Attention was then turned to the vagina.  A weighted speculum was placed in the posterior  fourchette.  petrussin mixture was placed circumferentially around the cervix.  With blunt and sharp dissection the cervix was dissected away from the bowel and bladder.  Both uterosacral ligaments were clamped, cut and suture ligated and held.  The anterior and posterior culdesac was entered sharply using metzenbaum scissors.  The cardinal ligaments and  The uterine arteries were clamped, cut and suture ligated bilaterally.   Mocellation took place with the knife.   The uterus was then delivered.  The mcall suture was placed.   The vaginal cuff was closed with interrupted suture of 0 chromic.   the patient was given methylene blue.  Cystoscopy was performed and both ureters were seen to efflux fluroscsin  without difficulty. The bladder had full integrity with no suture or laceration visualized.  The vagina was inspected and the cuff was noted to be intact.  Attention was then turned back to the abdomen after removing top pair of gloves. The abdomen was reinsufflated with CO2 gas.   The abdomen and pelvis was copiously irrigated.  The cuff had some bleeding and was made hemostatic using gyrus cautery and erisita.       hemostasis was noted.     All trochars were removed under direct visualization using the laparoscope.  The umbilical fascia was reapproximated by tying the circumferential suture. The two 5 mm incisions were closed with 3-0 Monocryl via a subcuticular stitch.  All remaining skin incisions were closed with Dermabond and the 10 mm skin incisions were reinforced using Dermabond.  Sponge lap and needle counts were correct.  The  patient tolerated the procedure well and was returned to the PACU in stable condition.  The vagina was packed with estrace cream on 2 inch packing

## 2024-02-12 NOTE — Transfer of Care (Signed)
 Immediate Anesthesia Transfer of Care Note  Patient: Whitney Silva  Procedure(s) Performed: HYSTERECTOMY, VAGINAL, LAPAROSCOPY-ASSISTED, WITH LEFT SALPINGECTOMY (Vagina ) CYSTOSCOPY (Bladder)  Patient Location: PACU  Anesthesia Type:General  Level of Consciousness: awake and patient cooperative  Airway & Oxygen Therapy: Patient Spontanous Breathing  Post-op Assessment: Report given to RN and Post -op Vital signs reviewed and stable  Post vital signs: Reviewed and stable  Last Vitals:  Vitals Value Taken Time  BP 154/78 02/12/24 1231  Temp    Pulse 90 02/12/24 1241  Resp 19 02/12/24 1241  SpO2 98 % 02/12/24 1241  Vitals shown include unfiled device data.  Last Pain:  Vitals:   02/12/24 0819  TempSrc:   PainSc: 4       Patients Stated Pain Goal: 8 (02/12/24 0800)  Complications: No notable events documented.

## 2024-02-12 NOTE — Anesthesia Postprocedure Evaluation (Signed)
 Anesthesia Post Note  Patient: Whitney Silva  Procedure(s) Performed: HYSTERECTOMY, VAGINAL, LAPAROSCOPY-ASSISTED, WITH LEFT SALPINGECTOMY (Vagina ) CYSTOSCOPY (Bladder)     Patient location during evaluation: PACU Anesthesia Type: General Level of consciousness: awake and alert Pain management: pain level controlled Vital Signs Assessment: post-procedure vital signs reviewed and stable Respiratory status: spontaneous breathing, nonlabored ventilation, respiratory function stable and patient connected to nasal cannula oxygen Cardiovascular status: blood pressure returned to baseline and stable Postop Assessment: no apparent nausea or vomiting Anesthetic complications: no  No notable events documented.  Last Vitals:  Vitals:   02/12/24 1345 02/12/24 1423  BP: (!) 131/93 133/80  Pulse: 80 75  Resp: 18 18  Temp:  36.7 C  SpO2: 98% 100%    Last Pain:  Vitals:   02/12/24 1423  TempSrc: Oral  PainSc: 9                  Melvenia Stabs

## 2024-02-12 NOTE — Anesthesia Procedure Notes (Signed)
 Procedure Name: Intubation Date/Time: 02/12/2024 9:41 AM  Performed by: Ezzie Holstein, CRNAPre-anesthesia Checklist: Patient identified, Emergency Drugs available, Suction available and Patient being monitored Patient Re-evaluated:Patient Re-evaluated prior to induction Oxygen Delivery Method: Circle System Utilized Preoxygenation: Pre-oxygenation with 100% oxygen Induction Type: IV induction Ventilation: Mask ventilation without difficulty Laryngoscope Size: Mac and 3 Grade View: Grade I Tube type: Oral Tube size: 7.0 mm Number of attempts: 1 Airway Equipment and Method: Stylet Placement Confirmation: ETT inserted through vocal cords under direct vision, positive ETCO2 and breath sounds checked- equal and bilateral Secured at: 22 cm Tube secured with: Tape Dental Injury: Teeth and Oropharynx as per pre-operative assessment

## 2024-02-12 NOTE — H&P (Addendum)
 Whitney Silva is an 43 y.o. female. Pt presenting for hysterectomy secondary to heavy cycles from fibroids.  She had a D&C with a polyp that was benign.  She declined all other treatments and would like to proceed with hysterectomy.    Pertinent Gynecological History: Menses: regular every month with spotting approximately 1-3 days per month  Contraception: IUD  Blood transfusions: none Sexually transmitted diseases: no past history Previous GYN Procedures: DNC  Last mammogram: normal Date: 2024 Last pap: normal Date: 2024 OB History: G2, P2   Menstrual History: Menarche age: 37 Patient's last menstrual period was 02/06/2024 (exact date).    Past Medical History:  Diagnosis Date   Asthma    Chronic allergic rhinitis    Chronic migraine with aura without status migrainosus, not intractable 09/16/2009   previous neurologist seen by dr Omar Bibber   Hiatal hernia    History of esophageal stricture 11/11/2023   s/p  EGD w/ dilation   History of Helicobacter pylori infection 11/11/2023   EGD w/ positive h pylori gastritis,  treated   History of pre-eclampsia 2014   and PIH   History of pregnancy induced hypertension 2012   IDA (iron deficiency anemia)    Uterine leiomyoma    Wears glasses     Past Surgical History:  Procedure Laterality Date   DILATATION & CURETTAGE/HYSTEROSCOPY WITH MYOSURE  10/21/2022   Procedure: DILATATION & CURETTAGE/HYSTEROSCOPY WITH MYOSURE; REMOVAL OF FOUR MASSES;  Surgeon: Marylu Soda, MD;  Location: [facility];  Service: Gynecology;;   ESOPHAGOGASTRODUODENOSCOPY (EGD) WITH ESOPHAGEAL DILATION  11/11/2023   by dr Leonia Raman   INTRAUTERINE DEVICE (IUD) INSERTION N/A 10/21/2022   Procedure: INTRAUTERINE DEVICE (IUD) INSERTION;  Surgeon: Marylu Soda, MD;  Location: [facility];  Service: Gynecology;  Laterality: N/A;   RIGHT OOPHORECTOMY  2004   OVARIAN CYST    Family History  Problem Relation Age of Onset    Early death Mother 29   Kidney disease Mother    Glaucoma Father    Hypertension Father    Asthma Brother    Asthma Brother    Cancer Paternal Aunt    Cancer Paternal Uncle        LUNG   Asthma Daughter    Colon cancer Neg Hx    Esophageal cancer Neg Hx    Stomach cancer Neg Hx    Rectal cancer Neg Hx     Social History:  reports that she has never smoked. She has never used smokeless tobacco. She reports current alcohol use. She reports that she does not use drugs.  Allergies:  Allergies  Allergen Reactions   Asa [Aspirin] Hives and Rash    Avoids all NSAIDs    Medications Prior to Admission  Medication Sig Dispense Refill Last Dose/Taking   budesonide-formoterol (SYMBICORT) 80-4.5 MCG/ACT inhaler Inhale 2 puffs into the lungs 2 (two) times daily.   02/12/2024 at  5:59 AM   cetirizine (ZYRTEC) 10 MG tablet Take 10 mg by mouth daily as needed for allergies.   Unknown   Ferrous Sulfate (IRON) 325 (65 Fe) MG TABS Take 1 tablet by mouth daily.   Unknown   fluticasone (FLONASE) 50 MCG/ACT nasal spray Place 1 spray into both nostrils daily as needed for allergies or rhinitis.   Unknown   hydrOXYzine (ATARAX) 25 MG tablet Take 25 mg by mouth 2 (two) times daily as needed for itching.   Unknown   pantoprazole (PROTONIX) 40 MG tablet Take 40 mg by mouth  2 (two) times daily.   02/12/2024 at  6:00 AM   PARAGARD  INTRAUTERINE COPPER  IU by Intrauterine route once. Inserted 10-21-2022   Taking   topiramate  (TOPAMAX ) 100 MG tablet Take 100 mg by mouth at bedtime.   02/11/2024 Evening   UBRELVY  50 MG TABS TAKE 50 MG BY MOUTH AS NEEDED (MAY REPEAT ONCE IN 2 HOURS. NO MORE THAN 2 PILLS IN 24 H.). (Patient taking differently: Take 50 mg by mouth as needed (may repeat once in 2 hours. no more than 2 pills in 24 h.).) 8 tablet 4 Unknown   albuterol  (PROVENTIL ) (2.5 MG/3ML) 0.083% nebulizer solution Take 2.5 mg by nebulization every 6 (six) hours as needed for wheezing or shortness of breath.   More than  a month   albuterol  (VENTOLIN  HFA) 108 (90 Base) MCG/ACT inhaler Inhale 2 puffs into the lungs every 4 (four) hours as needed for wheezing or shortness of breath.   More than a month    ROS  Blood pressure (!) 144/91, pulse 97, temperature 98.1 F (36.7 C), temperature source Oral, resp. rate 17, height 5\' 5"  (1.651 m), weight 59 kg, last menstrual period 02/06/2024, SpO2 100%. Physical Exam Physical Examination: General appearance - alert, well appearing, and in no distress Chest - clear to auscultation, no wheezes, rales or rhonchi, symmetric air entry Heart - normal rate and regular rhythm Abdomen - soft, nontender, nondistended, no masses or organomegaly Pelvic - VULVA: normal appearing vulva with no masses, tenderness or lesions, VAGINA: normal appearing vagina with normal color and discharge, no lesions, CERVIX: normal appearing cervix without discharge or lesions, UTERUS: uterus is normal size, shape, consistency and nontender, enlarged to 12 week's size, ADNEXA: normal adnexa in size, nontender and no masses Extremities - Homan's sign negative bilaterally   Results for orders placed or performed during the hospital encounter of 02/12/24 (from the past 24 hours)  Pregnancy, urine POC     Status: None   Collection Time: 02/12/24  7:48 AM  Result Value Ref Range   Preg Test, Ur NEGATIVE NEGATIVE  CBC    Component Value Date/Time   WBC 3.9 (L) 02/10/2024 1000   RBC 4.30 02/10/2024 1000   HGB 10.1 (L) 02/10/2024 1000   HCT 33.8 (L) 02/10/2024 1000   PLT 426 (H) 02/10/2024 1000   MCV 78.6 (L) 02/10/2024 1000   MCH 23.5 (L) 02/10/2024 1000   MCHC 29.9 (L) 02/10/2024 1000   RDW 14.9 02/10/2024 1000   LYMPHSABS 1.4 06/19/2017 2129   MONOABS 1.8 (H) 06/19/2017 2129   EOSABS 0.0 06/19/2017 2129   BASOSABS 0.0 06/19/2017 2129      No results found.  Assessment/Plan: Menometrorrhagia  Fibroids.  Pt understands the risks to be but not limited to bleeding, infection,problems  with anesthesia, nerve damage from position during surgery and  damage to internal organs such as bowel, bladder and other major organs.  All other options have been reviewed with the patient.  They are but not limited to observation, hormonal management, D&C with and without ablation, [place], lupron and removal of just the diseased tissue.  Pt also signed a consent in the office Shanavia Makela A Zayah Keilman 02/12/2024, 9:19 AM Date of Initial H&P: today  History reviewed, patient examined, no change in status, stable for surgery.

## 2024-02-13 ENCOUNTER — Encounter (HOSPITAL_COMMUNITY): Payer: Self-pay | Admitting: Obstetrics and Gynecology

## 2024-02-13 DIAGNOSIS — D259 Leiomyoma of uterus, unspecified: Secondary | ICD-10-CM | POA: Diagnosis not present

## 2024-02-13 LAB — CBC
HCT: 26.1 % — ABNORMAL LOW (ref 36.0–46.0)
Hemoglobin: 8.2 g/dL — ABNORMAL LOW (ref 12.0–15.0)
MCH: 23.9 pg — ABNORMAL LOW (ref 26.0–34.0)
MCHC: 31.4 g/dL (ref 30.0–36.0)
MCV: 76.1 fL — ABNORMAL LOW (ref 80.0–100.0)
Platelets: 401 10*3/uL — ABNORMAL HIGH (ref 150–400)
RBC: 3.43 MIL/uL — ABNORMAL LOW (ref 3.87–5.11)
RDW: 14.8 % (ref 11.5–15.5)
WBC: 13.9 10*3/uL — ABNORMAL HIGH (ref 4.0–10.5)
nRBC: 0 % (ref 0.0–0.2)

## 2024-02-13 LAB — BASIC METABOLIC PANEL WITH GFR
Anion gap: 6 (ref 5–15)
BUN: <5 mg/dL — ABNORMAL LOW (ref 6–20)
CO2: 21 mmol/L — ABNORMAL LOW (ref 22–32)
Calcium: 9.1 mg/dL (ref 8.9–10.3)
Chloride: 103 mmol/L (ref 98–111)
Creatinine, Ser: 0.55 mg/dL (ref 0.44–1.00)
GFR, Estimated: >60 mL/min (ref >60)
Glucose, Bld: 117 mg/dL — ABNORMAL HIGH (ref 70–99)
Potassium: 3.1 mmol/L — ABNORMAL LOW (ref 3.5–5.1)
Sodium: 130 mmol/L — ABNORMAL LOW (ref 135–145)

## 2024-02-13 LAB — SURGICAL PATHOLOGY

## 2024-02-13 MED ORDER — ACETAMINOPHEN 500 MG PO TABS: 1000.0000 mg | ORAL_TABLET | Freq: Four times a day (QID) | ORAL | 0 refills | Status: AC

## 2024-02-13 MED ORDER — OXYCODONE HCL 5 MG PO TABS: 5.0000 mg | ORAL_TABLET | ORAL | 0 refills | Status: AC | PRN

## 2024-02-13 MED ORDER — METOCLOPRAMIDE HCL 10 MG PO TABS: 10.0000 mg | ORAL_TABLET | Freq: Three times a day (TID) | ORAL | 0 refills | Status: AC

## 2024-02-27 NOTE — Discharge Summary (Signed)
 Physician Discharge Summary  Patient ID: Whitney Silva MRN: [id number] DOB/AGE: [birth year] 43 y.o.  Admit date: 02/12/2024 Discharge date: 02/27/2024  Admission Diagnoses:  Discharge Diagnoses:  Principal Problem:   S/P laparoscopic assisted vaginal hysterectomy (LAVH)   Discharged Condition: good  Hospital Course: Pt s/p LVH cysto.  Doing well eating and drinking well .  DC home   Consults: None  Significant Diagnostic Studies: labs:   Treatments: IV hydration and surgery:   Discharge Exam: Blood pressure 115/65, pulse 79, temperature 98.9 F (37.2 C), temperature source Oral, resp. rate 14, height 5' 5 (1.651 m), weight 59 kg, last menstrual period 02/06/2024, SpO2 100%. General appearance: alert Pelvic: scant bleeding EXT no calf tenderness   Disposition: Discharge disposition: 01-Home or Self Care        Allergies as of 02/13/2024       Reactions   Asa [aspirin] Hives, Rash   Avoids all NSAIDs        Medication List     STOP taking these medications    PARAGARD  INTRAUTERINE COPPER  IU       TAKE these medications    acetaminophen  500 MG tablet Commonly known as: TYLENOL  Take 2 tablets (1,000 mg total) by mouth every 6 (six) hours.   albuterol  108 (90 Base) MCG/ACT inhaler Commonly known as: VENTOLIN  HFA Inhale 2 puffs into the lungs every 4 (four) hours as needed for wheezing or shortness of breath.   albuterol  (2.5 MG/3ML) 0.083% nebulizer solution Commonly known as: PROVENTIL  Take 2.5 mg by nebulization every 6 (six) hours as needed for wheezing or shortness of breath.   budesonide-formoterol 80-4.5 MCG/ACT inhaler Commonly known as: SYMBICORT Inhale 2 puffs into the lungs 2 (two) times daily.   cetirizine 10 MG tablet Commonly known as: ZYRTEC Take 10 mg by mouth daily as needed for allergies.   fluticasone 50 MCG/ACT nasal spray Commonly known as: FLONASE Place 1 spray into both nostrils daily as needed for allergies or  rhinitis.   hydrOXYzine 25 MG tablet Commonly known as: ATARAX Take 25 mg by mouth 2 (two) times daily as needed for itching.   Iron 325 (65 Fe) MG Tabs Take 1 tablet by mouth daily.   metoCLOPramide  10 MG tablet Commonly known as: REGLAN  Take 1 tablet (10 mg total) by mouth 4 (four) times daily -  before meals and at bedtime for 7 days.   oxyCODONE  5 MG immediate release tablet Commonly known as: Oxy IR/ROXICODONE  Take 1-2 tablets (5-10 mg total) by mouth every 4 (four) hours as needed for moderate pain (pain score 4-6) or severe pain (pain score 7-10).   pantoprazole 40 MG tablet Commonly known as: PROTONIX Take 40 mg by mouth 2 (two) times daily.   topiramate  100 MG tablet Commonly known as: TOPAMAX  Take 100 mg by mouth at bedtime.   Ubrelvy  50 MG Tabs Generic drug: Ubrogepant  TAKE 50 MG BY MOUTH AS NEEDED (MAY REPEAT ONCE IN 2 HOURS. NO MORE THAN 2 PILLS IN 24 H.).        Follow-up Information     Inza Mikrut, MD Follow up in 1 week(s).   Specialty: Obstetrics and Gynecology Why: TH in one week.  visit in 6 weeks Contact information: [address] [place] [address] [phone]                 Signed: Florinda Hush Nashon Erbes 02/27/2024, 5:18 PM

## 2024-05-04 ENCOUNTER — Other Ambulatory Visit

## 2024-05-06 LAB — H. PYLORI ANTIGEN, STOOL: H pylori Ag, Stl: NEGATIVE

## 2024-05-09 ENCOUNTER — Other Ambulatory Visit: Payer: Self-pay | Admitting: Nurse Practitioner

## 2024-05-13 ENCOUNTER — Ambulatory Visit: Payer: Self-pay | Admitting: Gastroenterology
